# Patient Record
Sex: Female | Born: 1980
Health system: Southern US, Community
[De-identification: ages and names within clinical notes are randomized; demographics above are authoritative.]

## PROBLEM LIST (undated history)

## (undated) ENCOUNTER — Inpatient Hospital Stay (HOSPITAL_COMMUNITY): Payer: Self-pay

## (undated) DIAGNOSIS — F419 Anxiety disorder, unspecified: Secondary | ICD-10-CM

## (undated) DIAGNOSIS — F329 Major depressive disorder, single episode, unspecified: Secondary | ICD-10-CM

## (undated) DIAGNOSIS — T7840XA Allergy, unspecified, initial encounter: Secondary | ICD-10-CM

## (undated) DIAGNOSIS — A159 Respiratory tuberculosis unspecified: Secondary | ICD-10-CM

## (undated) DIAGNOSIS — R87619 Unspecified abnormal cytological findings in specimens from cervix uteri: Secondary | ICD-10-CM

## (undated) DIAGNOSIS — G43909 Migraine, unspecified, not intractable, without status migrainosus: Secondary | ICD-10-CM

## (undated) DIAGNOSIS — F32A Depression, unspecified: Secondary | ICD-10-CM

## (undated) DIAGNOSIS — IMO0002 Reserved for concepts with insufficient information to code with codable children: Secondary | ICD-10-CM

## (undated) DIAGNOSIS — G971 Other reaction to spinal and lumbar puncture: Secondary | ICD-10-CM

## (undated) HISTORY — DX: Allergy, unspecified, initial encounter: T78.40XA

## (undated) HISTORY — DX: Anxiety disorder, unspecified: F41.9

## (undated) HISTORY — PX: DILATION AND CURETTAGE OF UTERUS: SHX78

---

## 2006-07-24 ENCOUNTER — Inpatient Hospital Stay (HOSPITAL_COMMUNITY): Admission: AD | Admit: 2006-07-24 | Discharge: 2006-07-24 | Payer: Self-pay | Admitting: Obstetrics and Gynecology

## 2007-10-07 ENCOUNTER — Emergency Department (HOSPITAL_COMMUNITY): Admission: EM | Admit: 2007-10-07 | Discharge: 2007-10-07 | Payer: Self-pay | Admitting: Emergency Medicine

## 2008-04-04 ENCOUNTER — Emergency Department (HOSPITAL_COMMUNITY): Admission: EM | Admit: 2008-04-04 | Discharge: 2008-04-04 | Payer: Self-pay | Admitting: Emergency Medicine

## 2008-04-04 IMAGING — US US PELVIS COMPLETE MODIFY
1 series · 14 of 25 positions shown · non-contrast
Comparison: None.

CLINICAL DATA: Abdominal pain.

TRANSABDOMINAL AND TRANSVAGINAL ULTRASOUND OF PELVIS
TECHNIQUE: Both transabdominal and transvaginal ultrasound
examinations of the pelvis were performed including evaluation of
the uterus, ovaries, adnexal regions, and pelvic cul-de-sac.

[Series 1: unknown · 0.32mm/px · 14 of 59 slices shown]
[im 1/59]
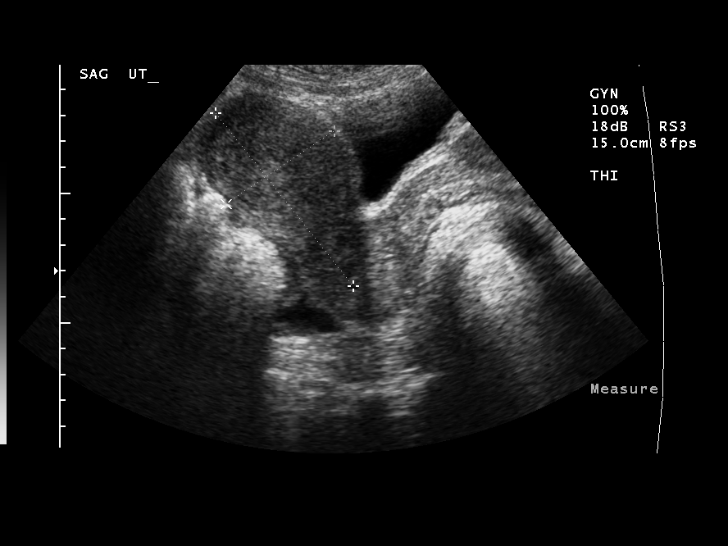
[im 5/59]
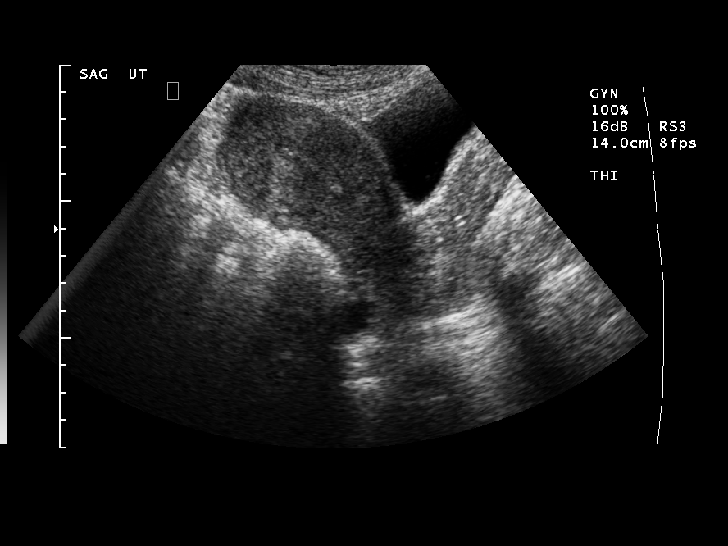
[im 10/59]
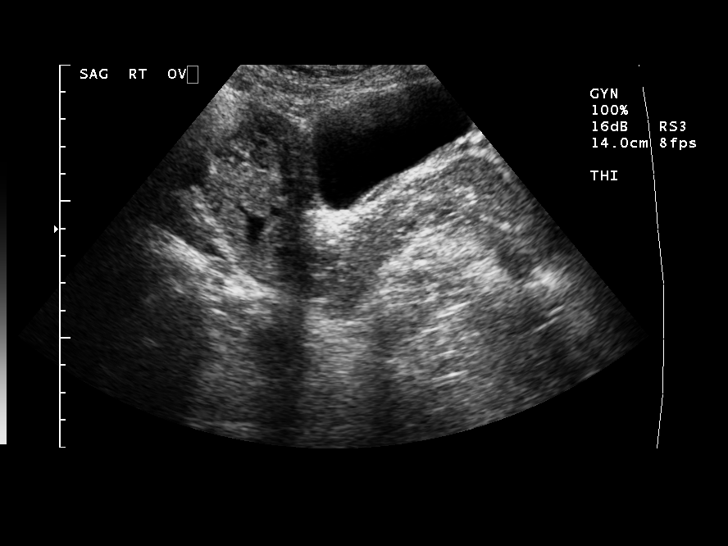
[im 15/59]
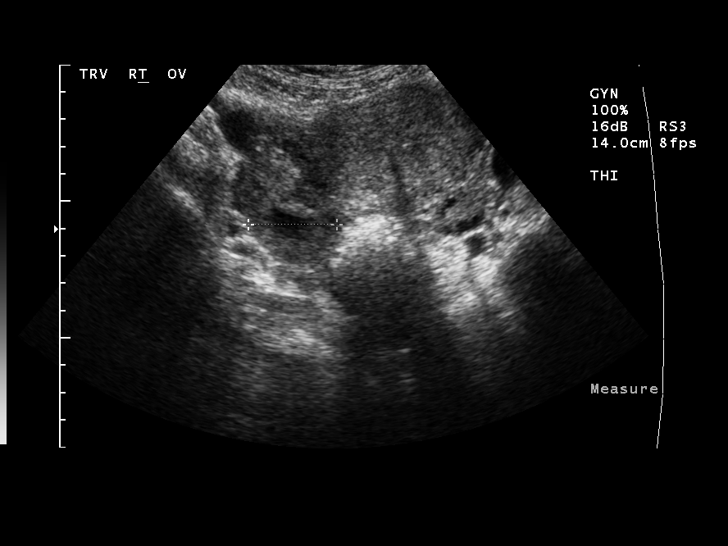
[im 20/59]
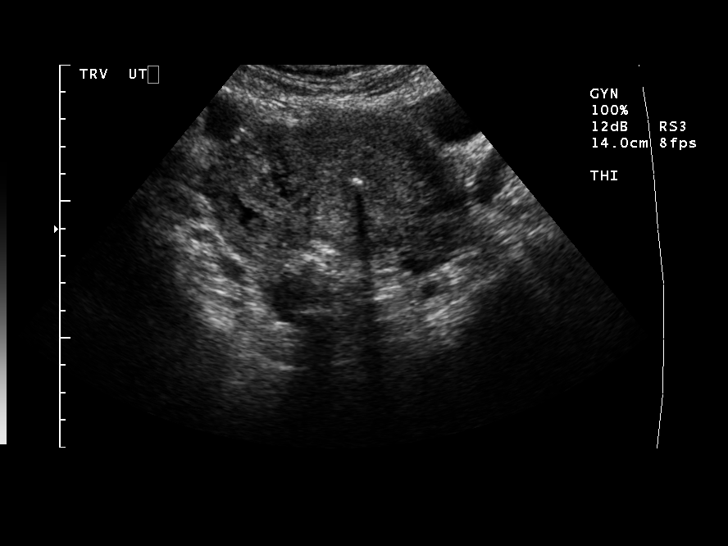
[im 22/59]
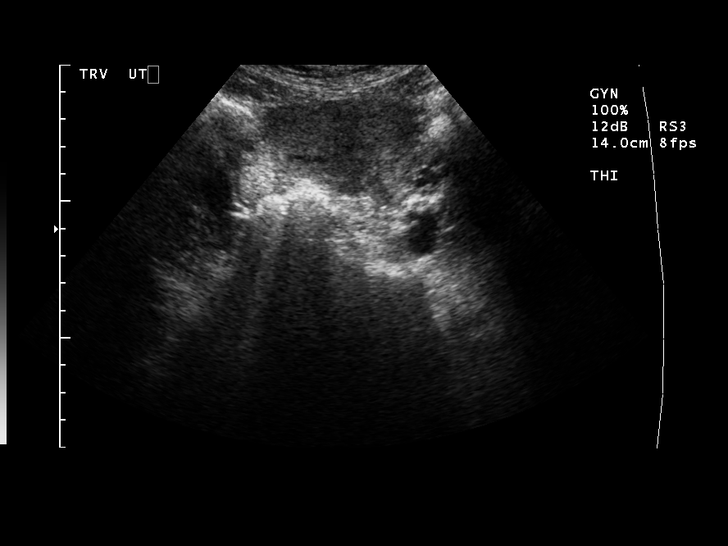
[im 27/59]
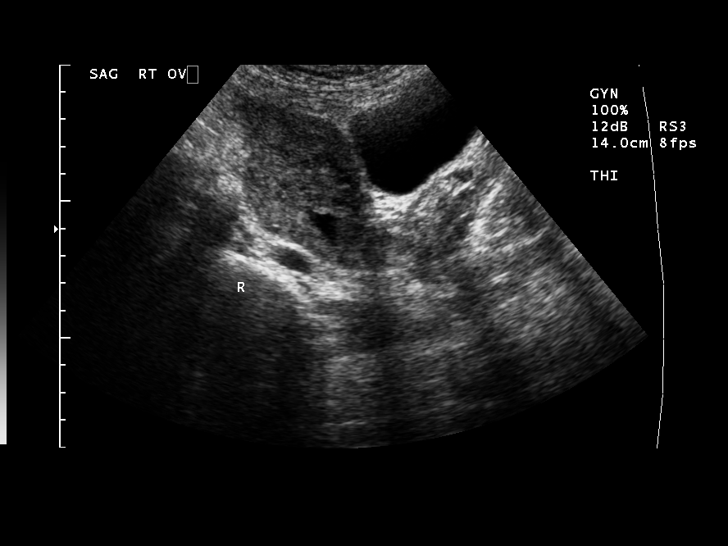
[im 32/59]
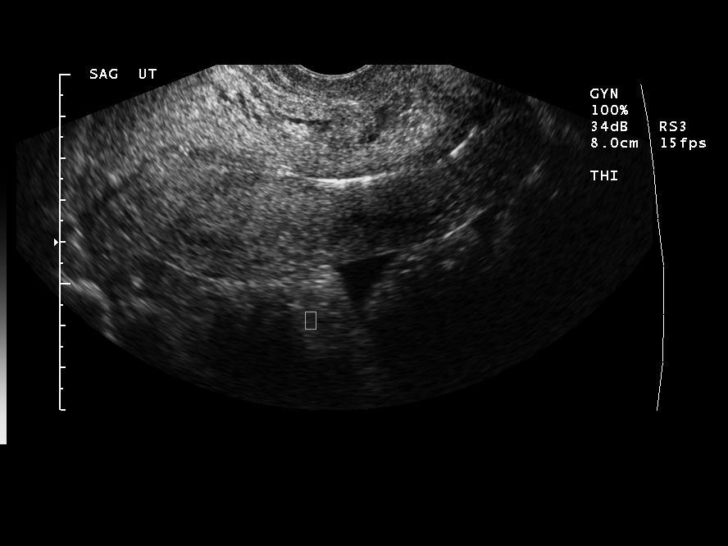
[im 37/59]
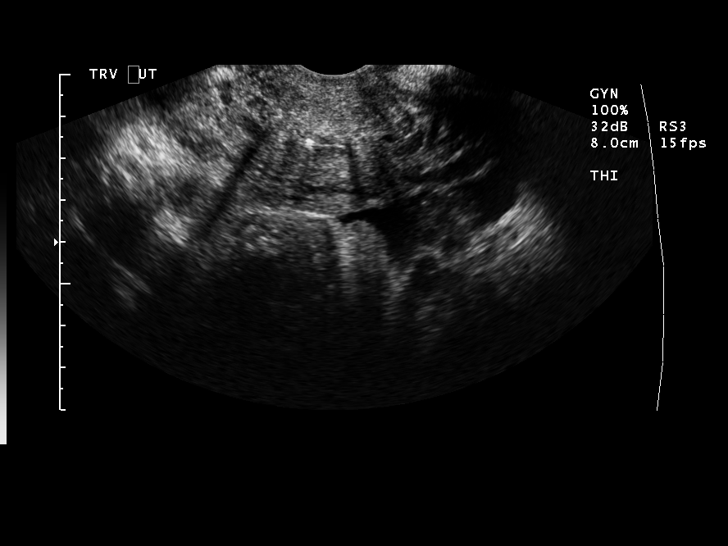
[im 39/59]
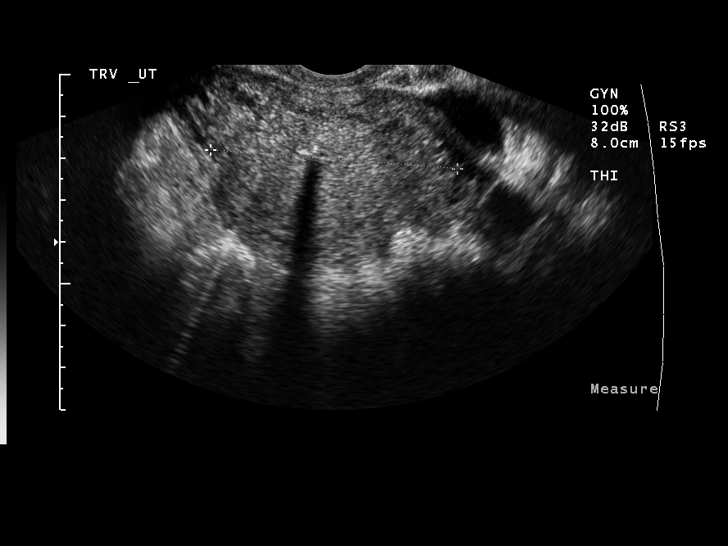
[im 44/59]
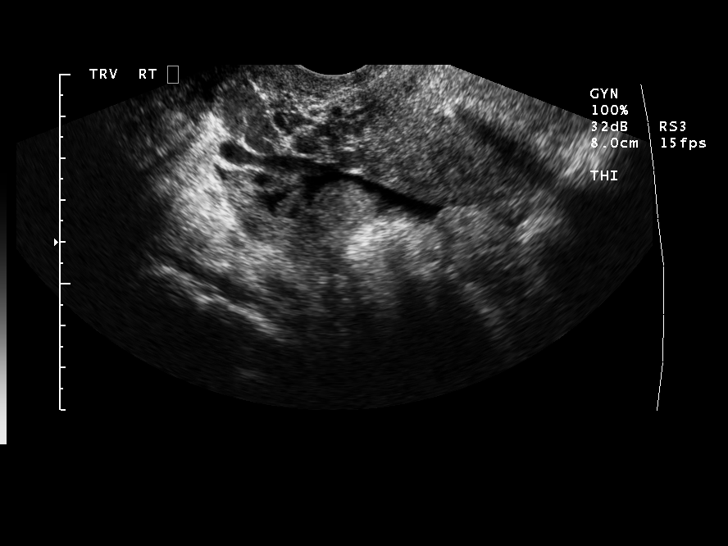
[im 49/59]
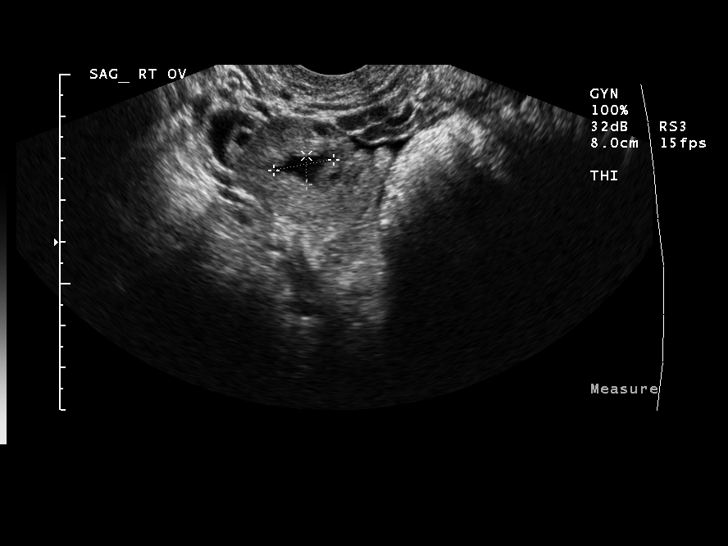
[im 54/59]
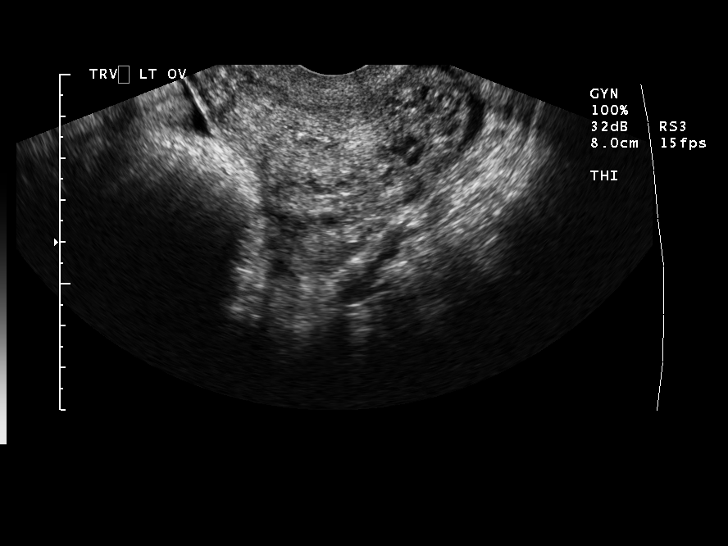
[im 59/59]
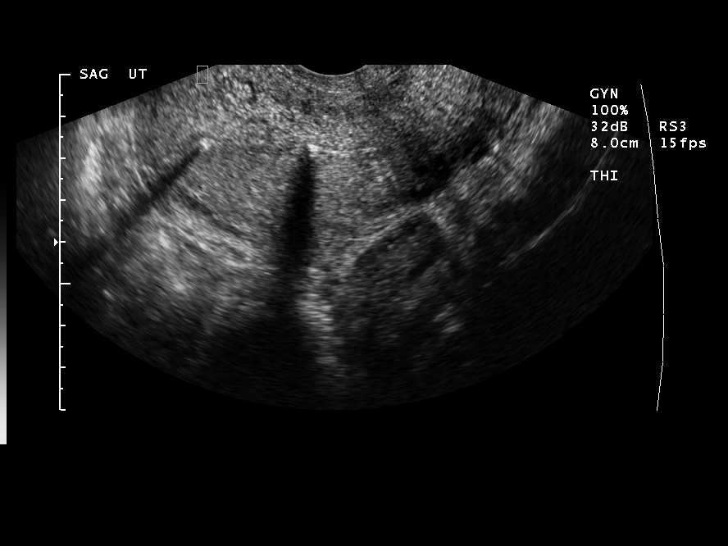

[14 of 25 positions shown; findings below may reference images not displayed]

FINDINGS: Uterus measures 8.5 x 5 x 6.8 cm.  IUD is in place.
Endometrial lining thickness 4.1 mm.  No focal uterine mass noted.

Right ovary measures 4.9 x 2.8 x 3.2 cm.  There is a dominant
cystic appearing structure measuring up to 1.5 cm which has
appearance suggesting ruptured cyst / collapsed follicle.

Left ovary measures 2.9 x 1.7 x 3.5 cm with small follicles but
without evidence of a dominant mass.

Small amount of free fluid is noted.
IMPRESSION: IUD is in place with the uterus otherwise unremarkable.

Collapsed right ovarian cyst / follicle.

No dominant left ovarian mass.

Small amount free fluid.

## 2008-10-13 ENCOUNTER — Emergency Department (HOSPITAL_COMMUNITY): Admission: EM | Admit: 2008-10-13 | Discharge: 2008-10-13 | Payer: Self-pay | Admitting: Emergency Medicine

## 2008-11-28 ENCOUNTER — Emergency Department (HOSPITAL_COMMUNITY): Admission: EM | Admit: 2008-11-28 | Discharge: 2008-11-28 | Payer: Self-pay | Admitting: Emergency Medicine

## 2009-01-18 ENCOUNTER — Emergency Department (HOSPITAL_COMMUNITY): Admission: EM | Admit: 2009-01-18 | Discharge: 2009-01-18 | Payer: Self-pay | Admitting: Emergency Medicine

## 2009-06-18 ENCOUNTER — Emergency Department (HOSPITAL_COMMUNITY): Admission: EM | Admit: 2009-06-18 | Discharge: 2009-06-18 | Payer: Self-pay | Admitting: Emergency Medicine

## 2009-06-18 IMAGING — CR DG ABDOMEN ACUTE W/ 1V CHEST
3 series · 3 of 3 positions shown · non-contrast
Comparison: None.

CLINICAL DATA: Nausea, abdominal cramping, diarrhea

ACUTE ABDOMEN SERIES (ABDOMEN 2 VIEW & CHEST 1 VIEW)

[w chest pa]
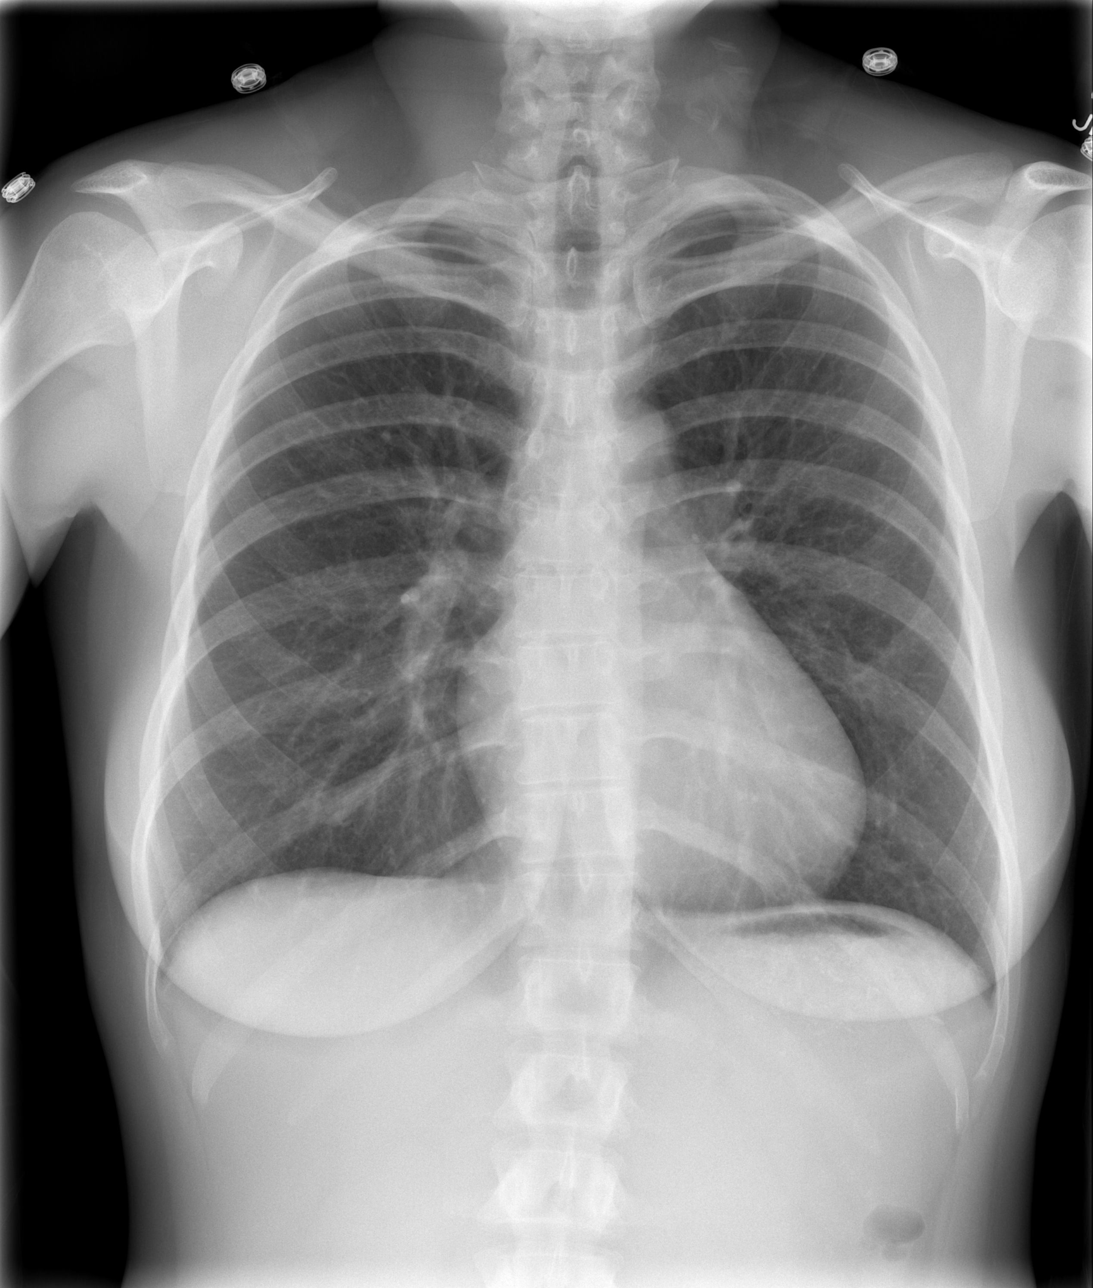

[w abdomen upright *]
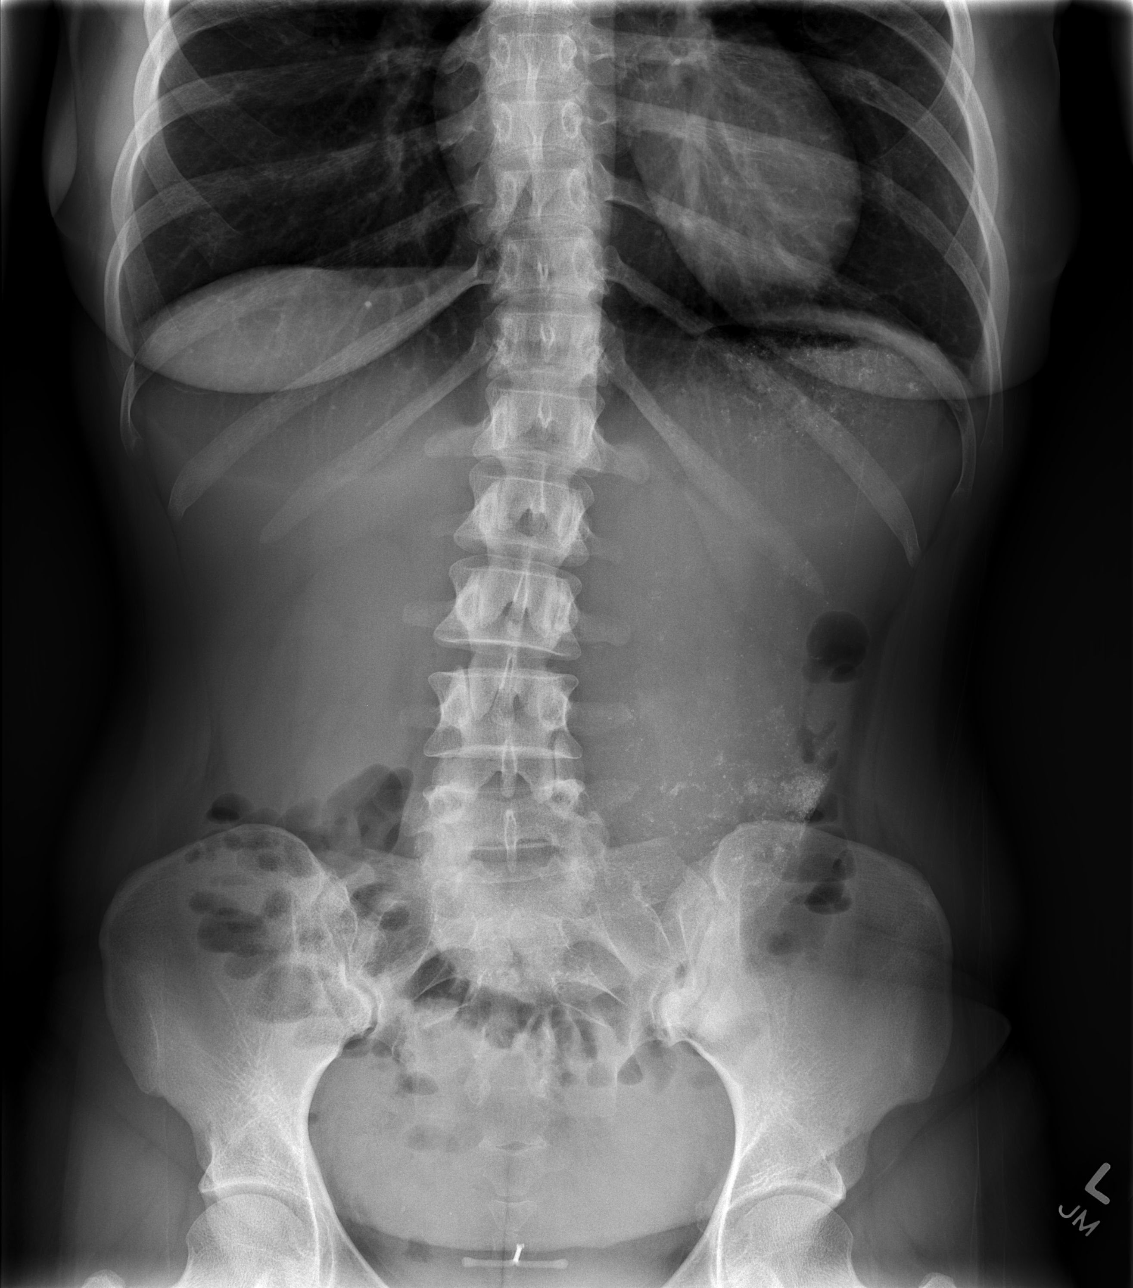

[t abdomen supine]
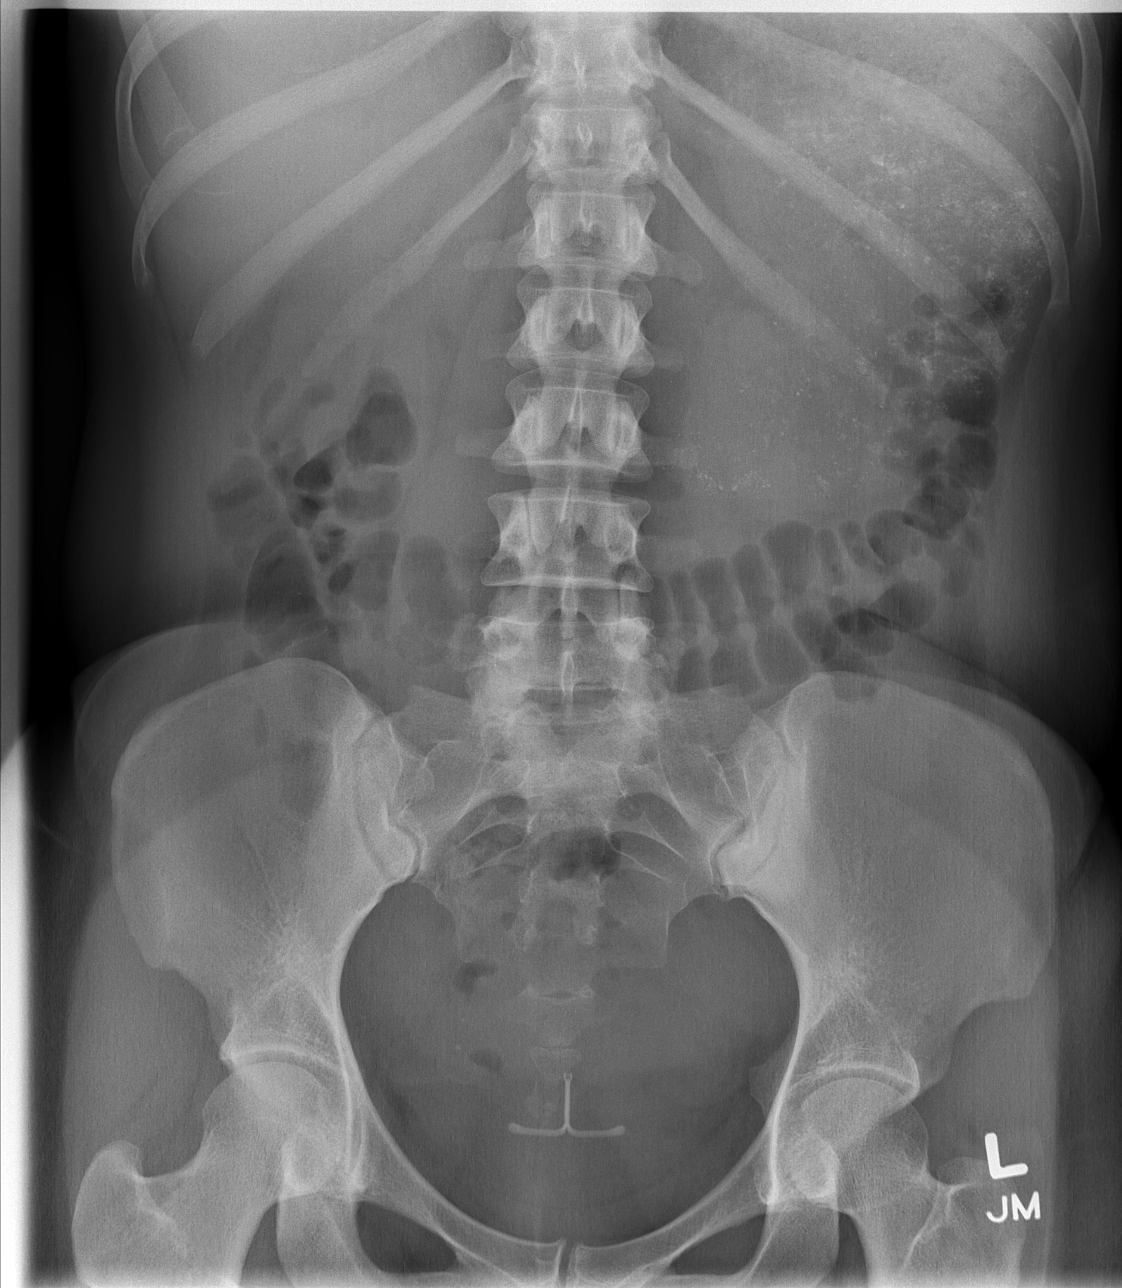

[3 of 3 positions shown; findings below may reference images not displayed]

FINDINGS: The lungs are clear.  Mediastinal contours are normal.
The heart is within normal limits in size.  No bony abnormality is
seen.

Supine and erect views the abdomen show a large amount of fluid
within the stomach.  No bowel obstruction is seen and no free air
is noted.  An IUD is noted in the mid pelvis.
IMPRESSION: 1.  No active lung disease.
2.  No bowel obstruction or free air.  Large amount of fluid within
the stomach.

## 2009-12-31 ENCOUNTER — Emergency Department (HOSPITAL_COMMUNITY): Admission: EM | Admit: 2009-12-31 | Discharge: 2009-12-31 | Payer: Self-pay | Admitting: Emergency Medicine

## 2010-07-27 LAB — POCT I-STAT, CHEM 8
Calcium, Ion: 0.85 mmol/L — ABNORMAL LOW (ref 1.12–1.32)
Chloride: 109 mEq/L (ref 96–112)
Creatinine, Ser: 0.7 mg/dL (ref 0.4–1.2)
Glucose, Bld: 99 mg/dL (ref 70–99)
Hemoglobin: 15 g/dL (ref 12.0–15.0)
Sodium: 142 mEq/L (ref 135–145)

## 2010-07-27 LAB — URINALYSIS, ROUTINE W REFLEX MICROSCOPIC
Glucose, UA: NEGATIVE mg/dL
Leukocytes, UA: NEGATIVE
Nitrite: NEGATIVE
Protein, ur: NEGATIVE mg/dL
Specific Gravity, Urine: 1.028 (ref 1.005–1.030)

## 2010-07-27 LAB — URINE MICROSCOPIC-ADD ON

## 2010-08-12 LAB — WET PREP, GENITAL
Trich, Wet Prep: NONE SEEN
WBC, Wet Prep HPF POC: NONE SEEN

## 2010-08-14 LAB — URINALYSIS, ROUTINE W REFLEX MICROSCOPIC
Bilirubin Urine: NEGATIVE
Glucose, UA: NEGATIVE mg/dL
Ketones, ur: NEGATIVE mg/dL
Nitrite: NEGATIVE
Urobilinogen, UA: 0.2 mg/dL (ref 0.0–1.0)

## 2010-08-14 LAB — WET PREP, GENITAL
Trich, Wet Prep: NONE SEEN
Yeast Wet Prep HPF POC: NONE SEEN

## 2010-08-14 LAB — GC/CHLAMYDIA PROBE AMP, GENITAL: GC Probe Amp, Genital: NEGATIVE

## 2010-08-15 LAB — URINALYSIS, ROUTINE W REFLEX MICROSCOPIC
Glucose, UA: NEGATIVE mg/dL
Nitrite: NEGATIVE
Protein, ur: NEGATIVE mg/dL
Specific Gravity, Urine: 1.012 (ref 1.005–1.030)

## 2010-08-15 LAB — URINE CULTURE: Colony Count: >=100000

## 2010-08-15 LAB — URINE MICROSCOPIC-ADD ON

## 2010-08-15 LAB — POCT PREGNANCY, URINE: Preg Test, Ur: NEGATIVE

## 2011-02-02 LAB — URINALYSIS, ROUTINE W REFLEX MICROSCOPIC
Bilirubin Urine: NEGATIVE
Glucose, UA: NEGATIVE
Leukocytes, UA: NEGATIVE
Nitrite: NEGATIVE
Protein, ur: NEGATIVE
Specific Gravity, Urine: 1.015
pH: 6

## 2011-02-02 LAB — DIFFERENTIAL
Lymphs Abs: 0.4 — ABNORMAL LOW
Neutrophils Relative %: 90 — ABNORMAL HIGH

## 2011-02-02 LAB — CBC
Hemoglobin: 14.8
MCHC: 33.4
MCV: 89
Platelets: 221
RBC: 4.98
RDW: 12.2
WBC: 10.6 — ABNORMAL HIGH

## 2011-02-02 LAB — POCT I-STAT, CHEM 8
BUN: 8
Calcium, Ion: 1.13
Chloride: 105
Potassium: 3.7
TCO2: 23

## 2011-02-02 LAB — WET PREP, GENITAL: Yeast Wet Prep HPF POC: NONE SEEN

## 2011-02-02 LAB — URINE MICROSCOPIC-ADD ON

## 2011-02-07 LAB — DIFFERENTIAL
Basophils Relative: 1
Eosinophils Absolute: 0.2
Neutrophils Relative %: 67

## 2011-02-07 LAB — BASIC METABOLIC PANEL
BUN: 10
CO2: 23
Chloride: 108
Creatinine, Ser: 0.55

## 2011-02-07 LAB — URINALYSIS, ROUTINE W REFLEX MICROSCOPIC
Glucose, UA: NEGATIVE
Hgb urine dipstick: NEGATIVE
Specific Gravity, Urine: 1.018
Urobilinogen, UA: 0.2

## 2011-02-07 LAB — GC/CHLAMYDIA PROBE AMP, GENITAL
Chlamydia, DNA Probe: NEGATIVE
GC Probe Amp, Genital: NEGATIVE

## 2011-02-07 LAB — PREGNANCY, URINE: Preg Test, Ur: NEGATIVE

## 2011-02-07 LAB — CBC
MCHC: 33.8
MCV: 90.8
Platelets: 178
RDW: 13.1

## 2011-02-07 LAB — WET PREP, GENITAL

## 2011-05-09 NOTE — L&D Delivery Note (Addendum)
Delivery Note At 4:49 AM a viable female was delivered via Vaginal, Spontaneous Delivery (Presentation:LOA ) with large amount of light MSF evident at birth.  APGAR: 8, 9; weight: pending Placenta status: Intact, spontaneously delivered via schultz w/ maternal pushing efforts.  Cord: 3vc, with the following complications: None.    Anesthesia: Epidural  Episiotomy: None Lacerations: None Suture Repair: n/a Est. Blood Loss (mL): 300  Mom to postpartum.  Baby to nursery-stable.  Wants to breastfeed.  Desires IP BTL- papers signed and in prenatals under media tab. NPO for now until know when BTL will occur.  Marge Duncans 03/24/2012, 5:12 AM

## 2011-05-31 ENCOUNTER — Encounter (HOSPITAL_COMMUNITY): Payer: Self-pay | Admitting: Emergency Medicine

## 2011-05-31 ENCOUNTER — Emergency Department (HOSPITAL_COMMUNITY)
Admission: EM | Admit: 2011-05-31 | Discharge: 2011-05-31 | Disposition: A | Discharge status: Home / Self Care | Payer: Self-pay | Attending: Emergency Medicine | Admitting: Emergency Medicine

## 2011-05-31 DIAGNOSIS — R112 Nausea with vomiting, unspecified: Secondary | ICD-10-CM | POA: Insufficient documentation

## 2011-05-31 DIAGNOSIS — F17200 Nicotine dependence, unspecified, uncomplicated: Secondary | ICD-10-CM | POA: Insufficient documentation

## 2011-05-31 DIAGNOSIS — R197 Diarrhea, unspecified: Secondary | ICD-10-CM | POA: Insufficient documentation

## 2011-05-31 DIAGNOSIS — R109 Unspecified abdominal pain: Secondary | ICD-10-CM | POA: Insufficient documentation

## 2011-05-31 DIAGNOSIS — IMO0001 Reserved for inherently not codable concepts without codable children: Secondary | ICD-10-CM | POA: Insufficient documentation

## 2011-05-31 DIAGNOSIS — R6883 Chills (without fever): Secondary | ICD-10-CM | POA: Insufficient documentation

## 2011-05-31 LAB — URINALYSIS, ROUTINE W REFLEX MICROSCOPIC
Ketones, ur: 15 mg/dL — AB
Leukocytes, UA: NEGATIVE
Nitrite: NEGATIVE
Protein, ur: NEGATIVE mg/dL
Urobilinogen, UA: 1 mg/dL (ref 0.0–1.0)
pH: 7.5 (ref 5.0–8.0)

## 2011-05-31 MED ORDER — ONDANSETRON 4 MG PO TBDP
ORAL_TABLET | ORAL | Status: AC
  Administered 2011-05-31: 4 mg via ORAL
  Filled 2011-05-31: qty 1

## 2011-05-31 MED ORDER — ONDANSETRON 4 MG PO TBDP
8.0000 mg | ORAL_TABLET | Freq: Once | ORAL | Status: AC
  Administered 2011-05-31 (×2): 4 mg via ORAL
  Filled 2011-05-31: qty 1

## 2011-05-31 NOTE — ED Provider Notes (Signed)
History     CSN: [id number]  Arrival date & time 05/31/11  [name] [name]   First MD Initiated Contact with Patient 05/31/11 2042      Chief Complaint  Patient presents with  . Emesis     Patient is a 31 y.o. female presenting with vomiting. The history is provided by the patient.  Emesis  This is a new problem. The current episode started 6 to 12 hours ago. The problem occurs 2 to 4 times per day. The problem has been gradually improving. The emesis has an appearance of stomach contents. There has been no fever. Associated symptoms include abdominal pain, chills, diarrhea and myalgias. Risk factors include suspect food intake and ill contacts.  pt denies fever No blood in stool/vomitus No recent abx Thinks it is related to recent meal with chicken +sick contacts Also, had IUD removed today, but no vag bleeding/discharge reported She did have some abd cramping but none at this time  PMh - none  History reviewed. No pertinent past surgical history.  No family history on file.  History  Substance Use Topics  . Smoking status: Current Everyday Smoker  . Smokeless tobacco: Not on file  . Alcohol Use: Yes    OB History    Grav Para Term Preterm Abortions TAB SAB Ect Mult Living                  Review of Systems  Constitutional: Positive for chills.  Gastrointestinal: Positive for vomiting, abdominal pain and diarrhea.  Musculoskeletal: Positive for myalgias.    Allergies  Lexapro  Home Medications   Current Outpatient Rx  Name Route Sig Dispense Refill  . ACETAMINOPHEN 500 MG PO TABS Oral Take 500 mg by mouth every 6 (six) hours as needed. For pain      BP 102/63  Pulse 82  Temp(Src) 98.7 F (37.1 C) (Oral)  Resp 16  SpO2 99%  LMP 04/27/2011  Physical Exam CONSTITUTIONAL: Well developed/well nourished HEAD AND FACE: Normocephalic/atraumatic EYES: EOMI/PERRL, no scleral icterus ENMT: Mucous membranes moist NECK: supple no meningeal signs SPINE:entire spine  nontender CV: S1/S2 noted, no murmurs/rubs/gallops noted LUNGS: Lungs are clear to auscultation bilaterally, no apparent distress ABDOMEN: soft, nontender, no rebound or guarding GU:no cva tenderness NEURO: Pt is awake/alert, moves all extremitiesx4 EXTREMITIES: pulses normal, full ROM SKIN: warm, color normal PSYCH: no abnormalities of mood noted  ED Course  Procedures  Labs Reviewed  URINALYSIS, ROUTINE W REFLEX MICROSCOPIC - Abnormal; Notable for the following:    Specific Gravity, Urine 1.037 (*)    Ketones, ur 15 (*)    All other components within normal limits  POCT PREGNANCY, URINE  POCT PREGNANCY, URINE     1. Nausea vomiting and diarrhea       MDM  Nursing notes reviewed and considered in documentation All labs/vitals reviewed and considered  Pt improved with oral zofran, no distress, well appearing With both vomiting and diarrhea over past day, suspect self limited process Doubt acute abd/gyn process        Joya Gaskins, MD 05/31/11 2133

## 2011-05-31 NOTE — ED Notes (Signed)
Pt. Escorted to [facility] to find family.

## 2011-05-31 NOTE — ED Notes (Signed)
Pt c/o nausea, vomiting, abd cramping, and chills since this morning.  Symptoms worse since having mirena removed at noon.  Pt thinks symptoms are related to eating chicken that she did not cook completely last night.

## 2011-08-21 ENCOUNTER — Other Ambulatory Visit (HOSPITAL_COMMUNITY): Payer: Self-pay | Admitting: Family

## 2011-08-21 DIAGNOSIS — Z331 Pregnant state, incidental: Secondary | ICD-10-CM

## 2011-08-21 LAB — OB RESULTS CONSOLE HIV ANTIBODY (ROUTINE TESTING): HIV: NONREACTIVE

## 2011-08-21 LAB — OB RESULTS CONSOLE RUBELLA ANTIBODY, IGM: Rubella: IMMUNE

## 2011-08-21 LAB — OB RESULTS CONSOLE GC/CHLAMYDIA
Chlamydia: NEGATIVE
Gonorrhea: NEGATIVE

## 2011-08-21 LAB — OB RESULTS CONSOLE HEPATITIS B SURFACE ANTIGEN: Hepatitis B Surface Ag: NEGATIVE

## 2011-08-21 LAB — OB RESULTS CONSOLE ANTIBODY SCREEN: Antibody Screen: NEGATIVE

## 2011-08-21 LAB — OB RESULTS CONSOLE ABO/RH

## 2011-08-28 ENCOUNTER — Other Ambulatory Visit (HOSPITAL_COMMUNITY): Payer: Self-pay | Admitting: Family

## 2011-08-28 ENCOUNTER — Ambulatory Visit (HOSPITAL_COMMUNITY)
Admission: RE | Admit: 2011-08-28 | Discharge: 2011-08-28 | Disposition: A | Discharge status: Home / Self Care | Payer: Medicaid Other | Source: Ambulatory Visit | Attending: Family | Admitting: Family

## 2011-08-28 DIAGNOSIS — O99330 Smoking (tobacco) complicating pregnancy, unspecified trimester: Secondary | ICD-10-CM | POA: Insufficient documentation

## 2011-08-28 DIAGNOSIS — Z331 Pregnant state, incidental: Secondary | ICD-10-CM

## 2011-08-28 DIAGNOSIS — Z3689 Encounter for other specified antenatal screening: Secondary | ICD-10-CM | POA: Insufficient documentation

## 2011-08-28 IMAGING — US US OB COMP LESS 14 WK
1 series · 13 of 28 positions shown · non-contrast
Comparison: none

[Series 1: us ob comp +14 wk · 13 of 45 slices shown]
[im 2/45]
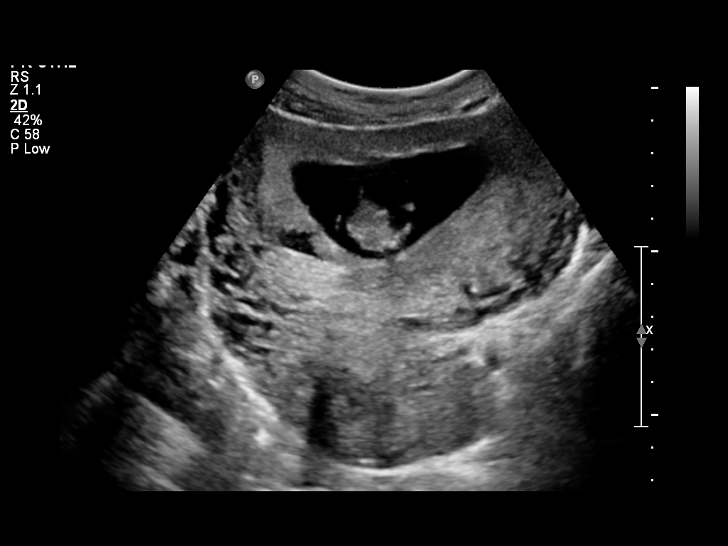
[im 5/45]
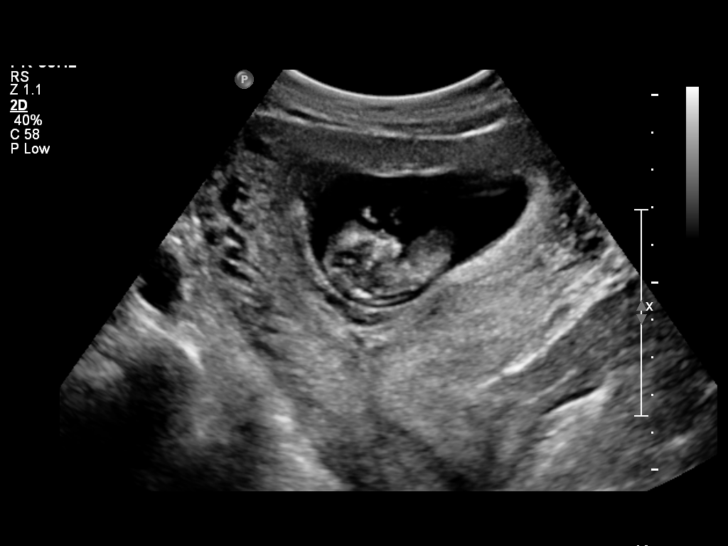
[im 9/45]
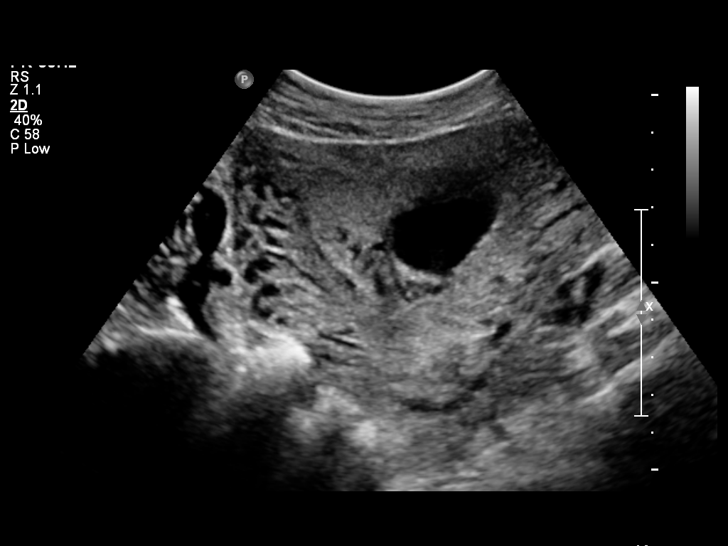
[im 12/45]
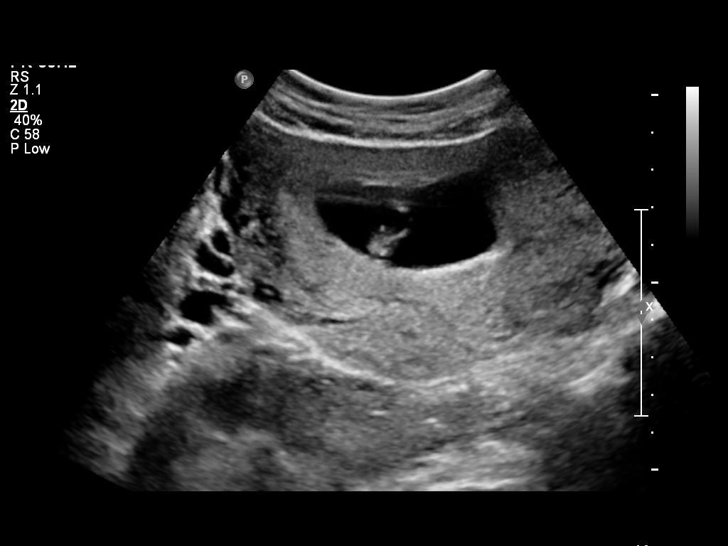
[im 15/45]
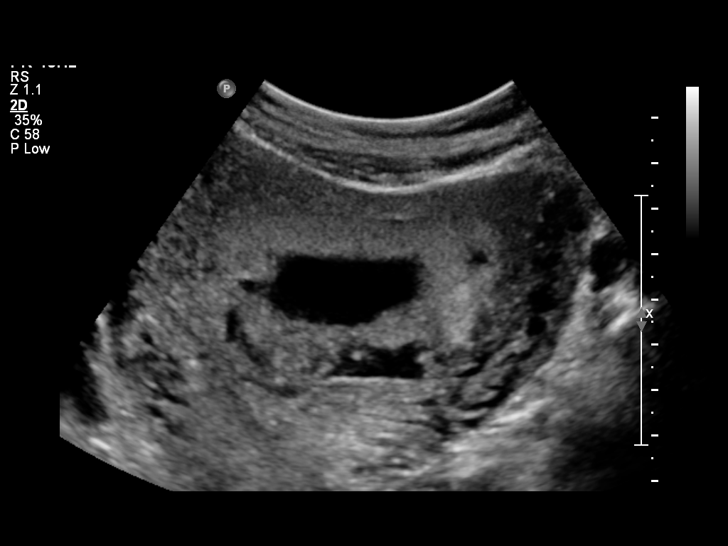
[im 18/45]
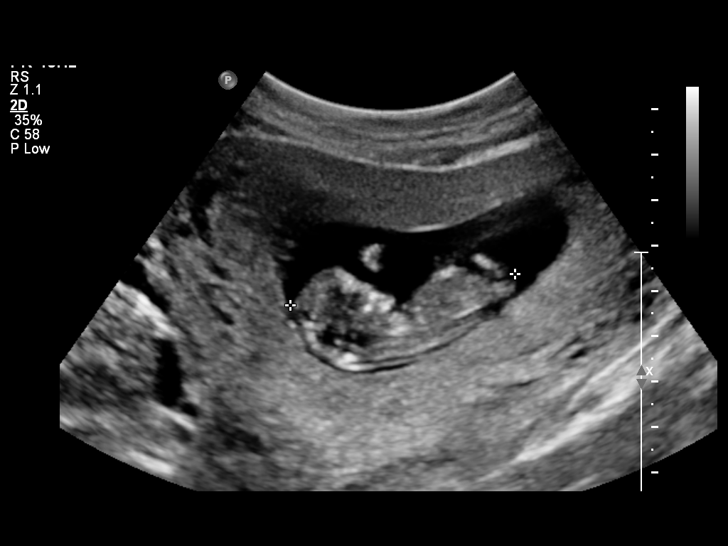
[im 23/45]
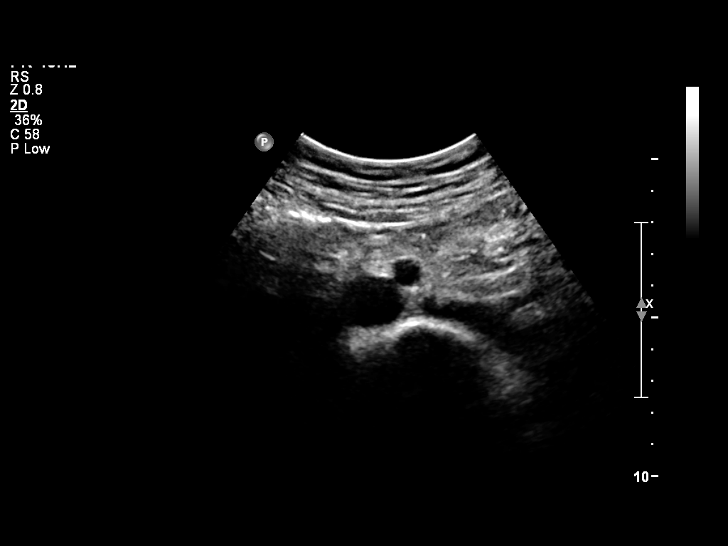
[im 27/45]
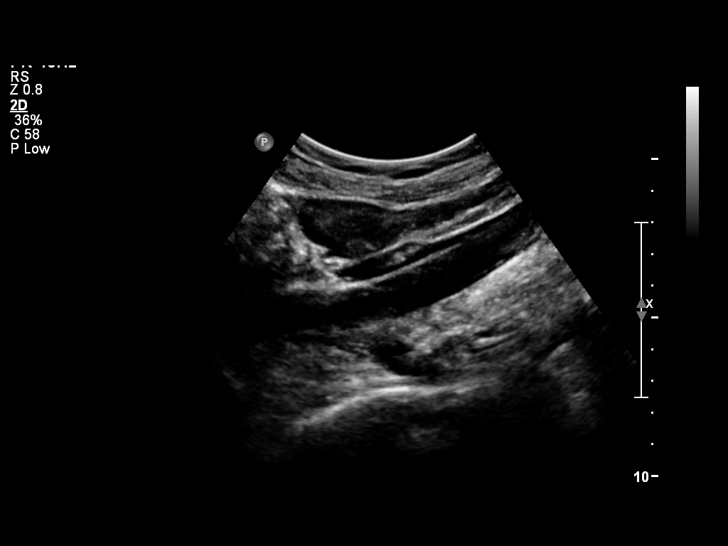
[im 30/45]
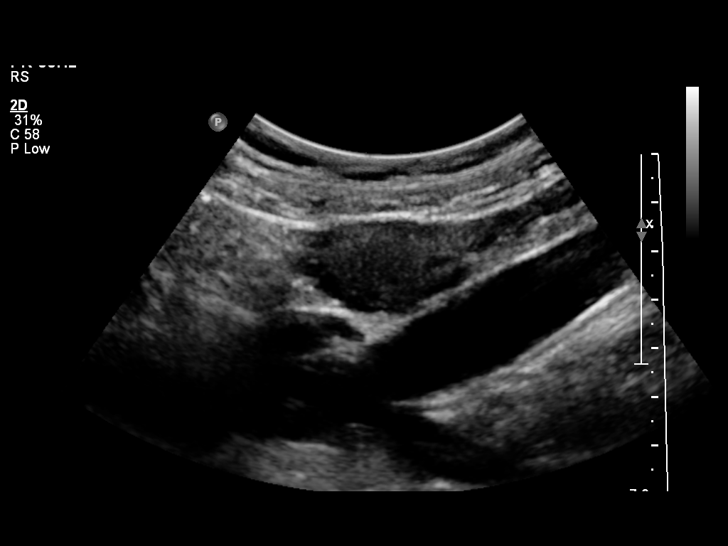
[im 33/45]
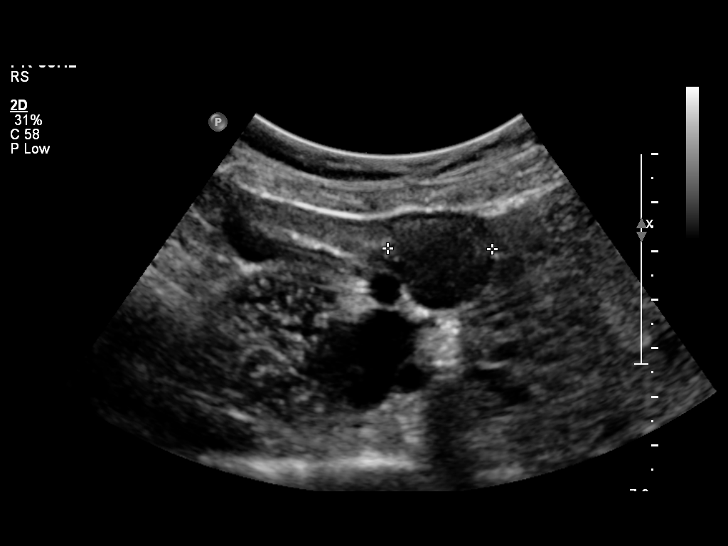
[im 36/45]
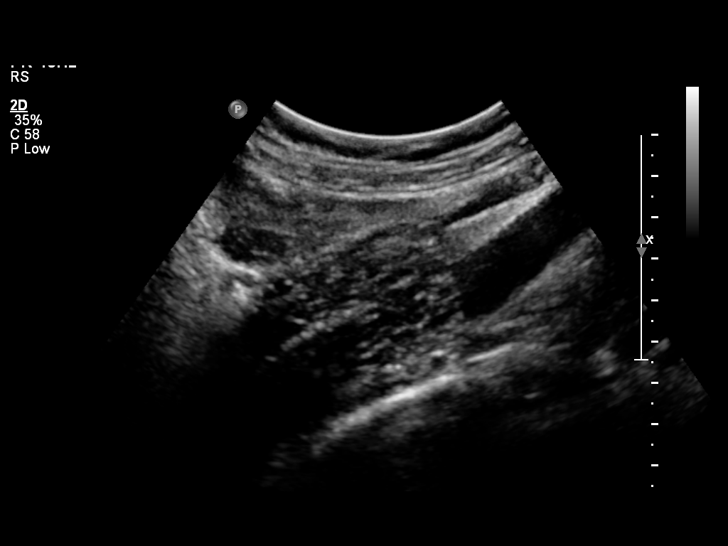
[im 40/45]
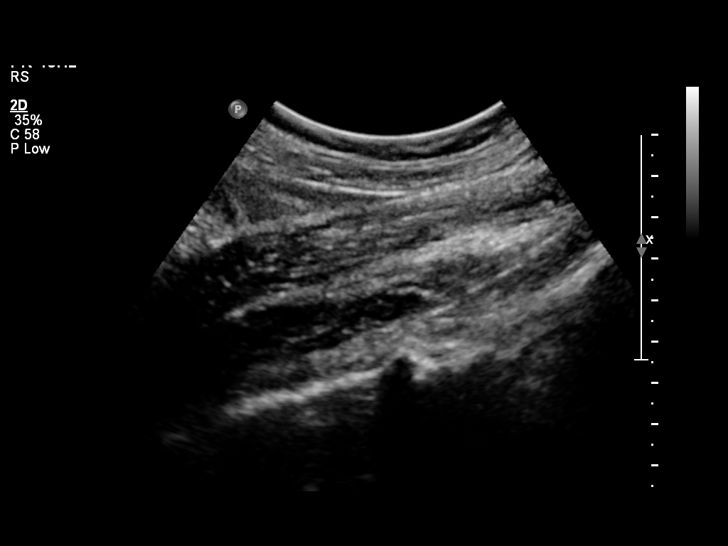
[im 43/45]
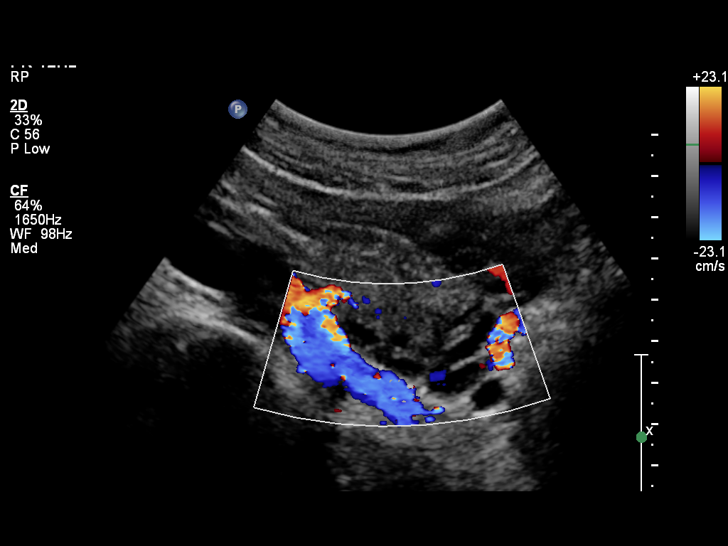

[13 of 28 positions shown; findings below may reference images not displayed]

OBSTETRICS REPORT
                      (Signed Final [DATE] [DATE])

 Order#:         [PHONE_NUMBER]_O
Procedures

 US OB COMP LESS 14 WKS                                76801.0
Indications

 Uncertain LMP;  Establish Gestational [AGE]
 Cigarette smoker
Fetal Evaluation

 Preg. Location:    Intrauterine
 Gest. Sac:         Intrauterine
 Yolk Sac:          Not visualized
 Fetal Pole:        Visualized
 Fetal Heart Rate:  165                         bpm
 Cardiac Activity:  Observed
Biometry

 CRL:     48.9  mm    G. Age:   11w 4d                 EDD:   [DATE]
Gestational Age

 Best:          11w 4d    Det. By:   U/S C R L ([DATE])     EDD:   [DATE]
Cervix Uterus Adnexa

 Cervix:       Closed. Normal appearance by transabdominal scan.
 Uterus:       No abnormality visualized.
 Cul De Sac:   No free fluid seen.
 Left Ovary:   Within normal limits.
 Right Ovary:  Within normal limits. Small corpus luteum noted.
 Adnexa:     No abnormality visualized.
Impression

 Single living IUP with US Gest. Age of 11w 4d, and EDD of
 [DATE].
 Small (1.8 x 0.7 x .9 cm) subchorionic fluid collection. Likely
 small subchorionic hemorrhage.
Recommendations

 US for fetal anatomic evaluation at 18-19 wks GA.

 questions or concerns.

## 2011-09-18 ENCOUNTER — Other Ambulatory Visit (HOSPITAL_COMMUNITY): Payer: Self-pay | Admitting: Family

## 2011-09-18 DIAGNOSIS — Z0489 Encounter for examination and observation for other specified reasons: Secondary | ICD-10-CM

## 2011-09-18 DIAGNOSIS — IMO0002 Reserved for concepts with insufficient information to code with codable children: Secondary | ICD-10-CM

## 2011-10-13 ENCOUNTER — Encounter (HOSPITAL_COMMUNITY): Payer: Self-pay

## 2011-10-13 ENCOUNTER — Ambulatory Visit (HOSPITAL_COMMUNITY)
Admission: RE | Admit: 2011-10-13 | Discharge: 2011-10-13 | Disposition: A | Discharge status: Home / Self Care | Payer: Medicaid Other | Source: Ambulatory Visit | Attending: Family | Admitting: Family

## 2011-10-13 DIAGNOSIS — Z363 Encounter for antenatal screening for malformations: Secondary | ICD-10-CM | POA: Insufficient documentation

## 2011-10-13 DIAGNOSIS — Z0489 Encounter for examination and observation for other specified reasons: Secondary | ICD-10-CM

## 2011-10-13 DIAGNOSIS — Z1389 Encounter for screening for other disorder: Secondary | ICD-10-CM | POA: Insufficient documentation

## 2011-10-13 DIAGNOSIS — O358XX0 Maternal care for other (suspected) fetal abnormality and damage, not applicable or unspecified: Secondary | ICD-10-CM | POA: Insufficient documentation

## 2011-10-13 DIAGNOSIS — IMO0002 Reserved for concepts with insufficient information to code with codable children: Secondary | ICD-10-CM

## 2011-10-13 IMAGING — US US OB DETAIL+14 WK
1 of 2 series · 12 of 28 positions shown · non-contrast
Comparison: none

[Series 1: us ob detail +14 wk · 12 of 101 slices shown]
[im 1/101]
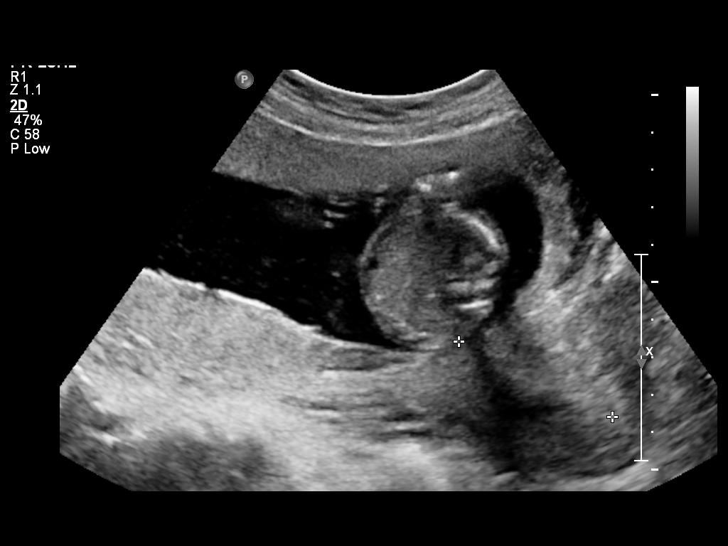
[im 8/101]
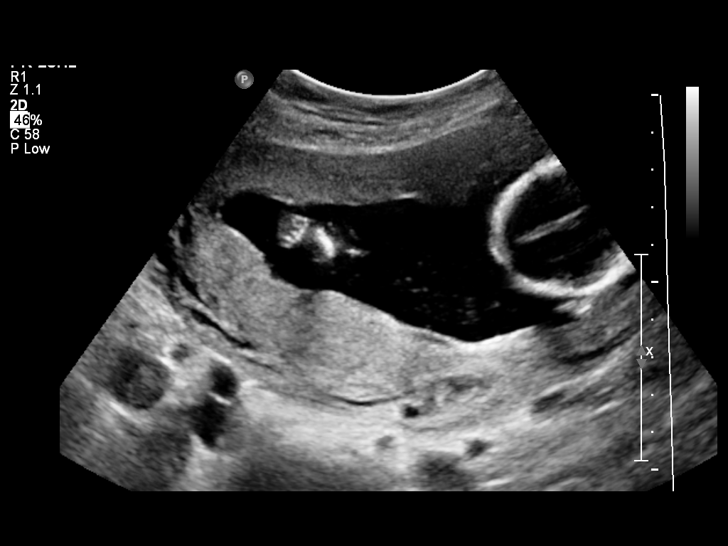
[im 16/101]
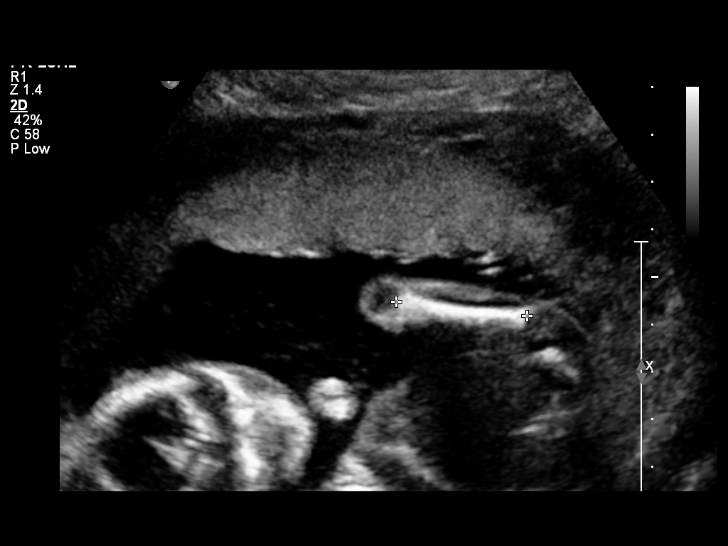
[im 27/101]
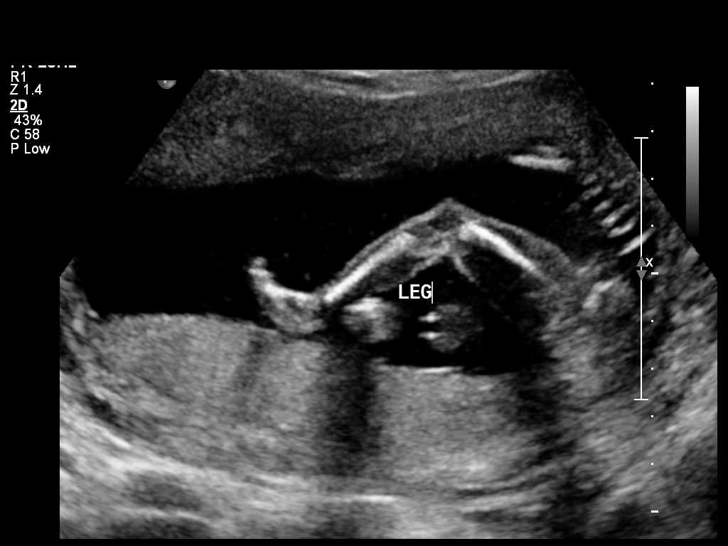
[im 35/101]
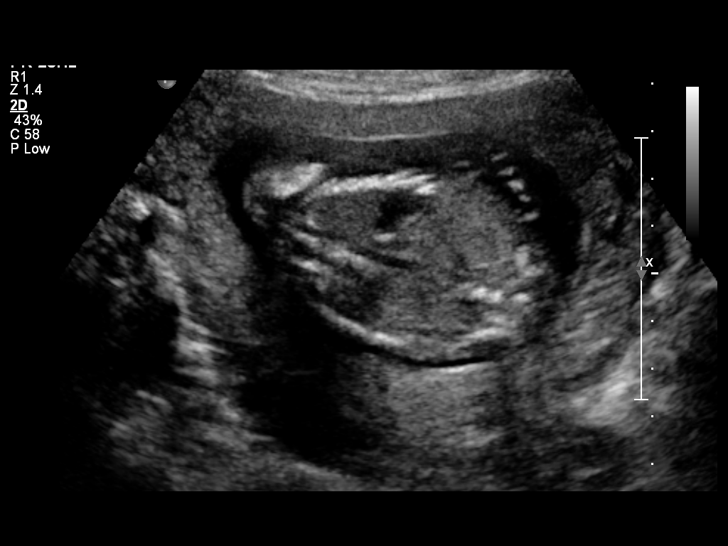
[im 43/101]
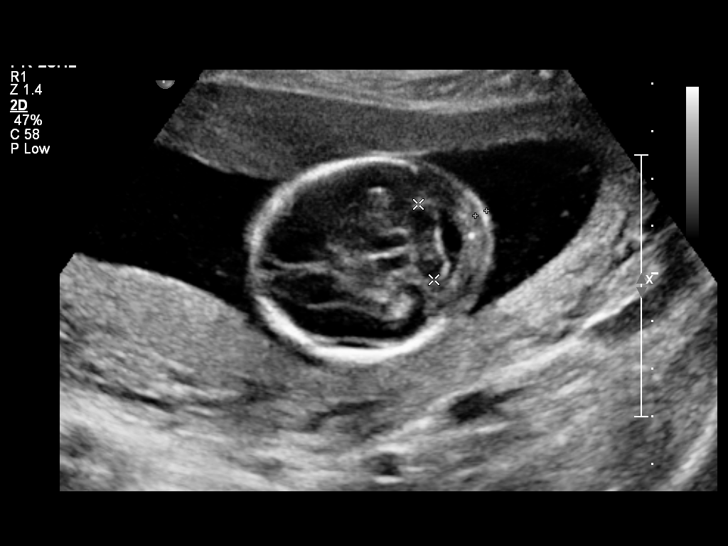
[im 54/101]
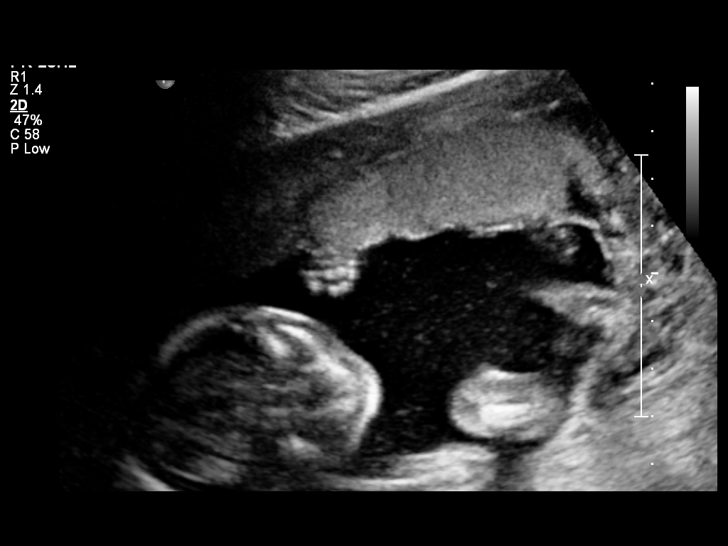
[im 62/101]
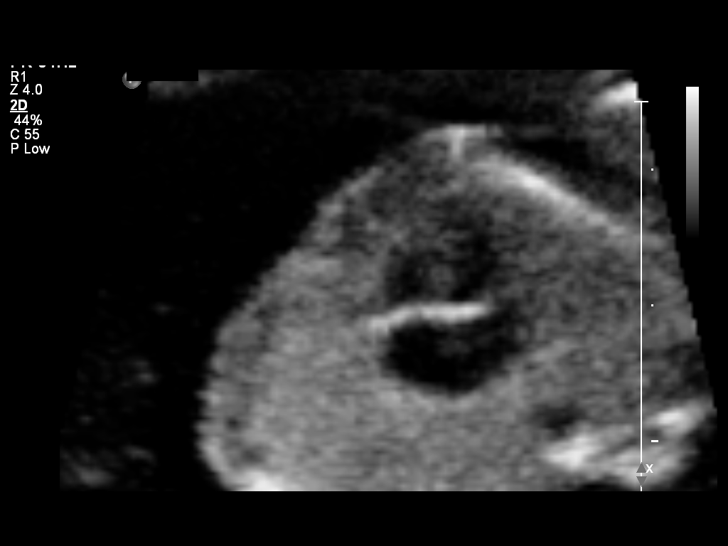
[im 70/101]
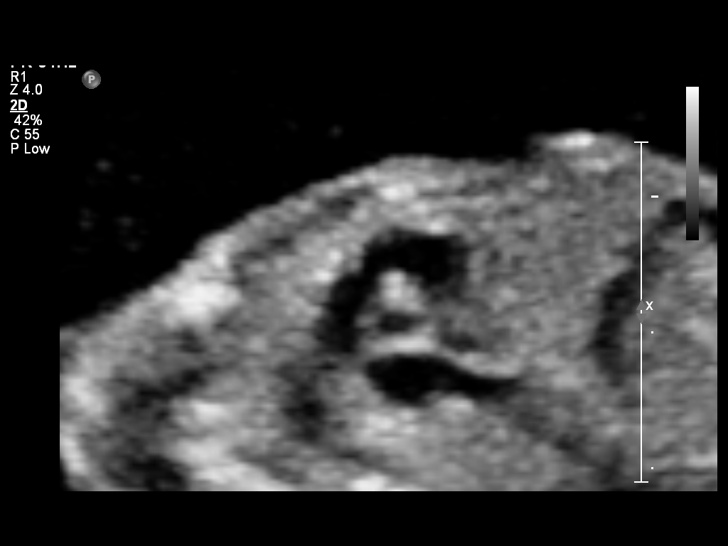
[im 81/101]
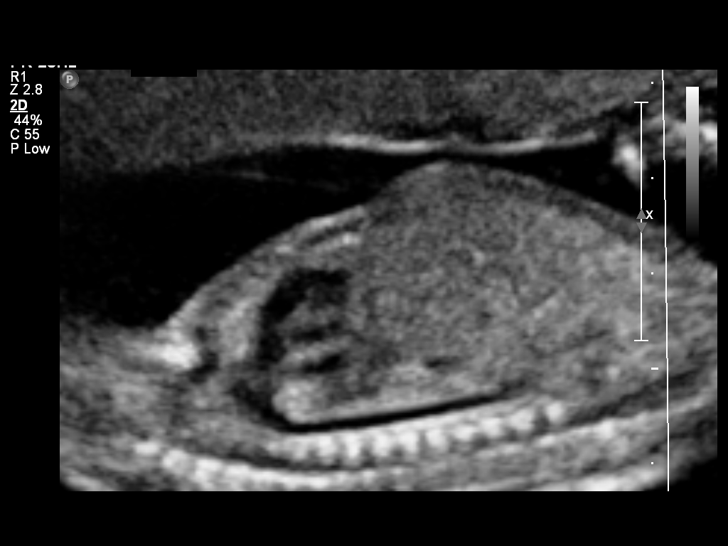
[im 89/101]
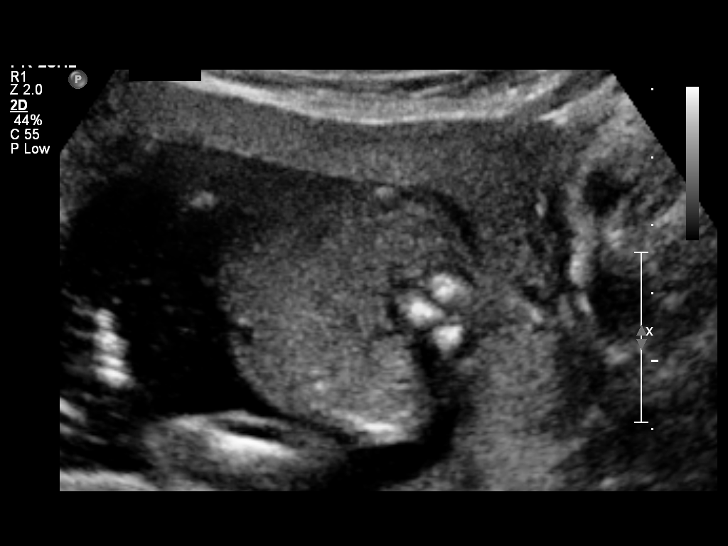
[im 97/101]
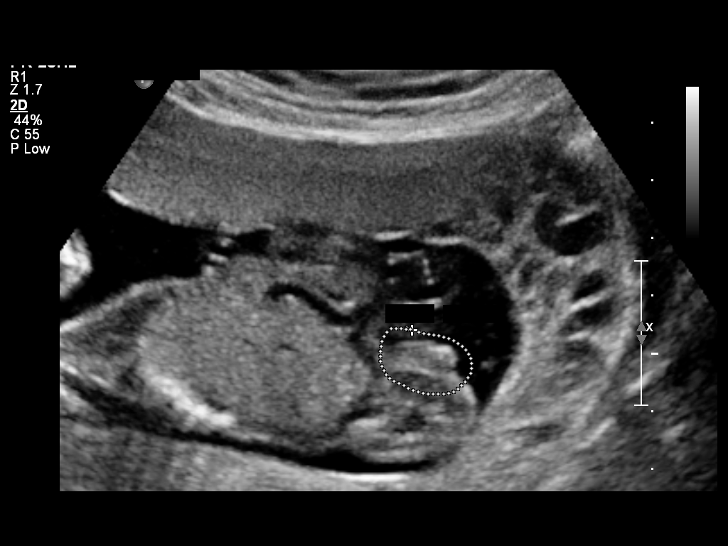

[12 of 28 positions shown; findings below may reference images not displayed]

OBSTETRICS REPORT
                      (Signed Final [DATE] [DATE])

 Order#:         [PHONE_NUMBER]_O
Procedures

 US OB DETAIL + 14 WK                                  76811.0
Indications

 Detailed fetal anatomic survey                        655.83 [KV]
Fetal Evaluation

 Fetal Heart Rate:  155                         bpm
 Cardiac Activity:  Observed
 Presentation:      Breech
 Placenta:          Right lateral, above
                    cervical os
 P. Cord            Visualized
 Insertion:

 Amniotic Fluid
 AFI FV:      Subjectively within normal limits
                                             Larg Pckt:     5.1  cm
Biometry

 BPD:     41.1  mm    G. Age:   18w 3d                CI:        76.03   70 - 86
                                                      FL/HC:      17.7   15.8 -
                                                                         18
 HC:     149.4  mm    G. Age:   18w 0d       37  %    HC/AC:      1.16   1.07 -

 AC:     129.3  mm    G. Age:   18w 4d       59  %    FL/BPD:
 FL:      26.4  mm    G. Age:   18w 0d       41  %    FL/AC:      20.4   20 - 24
 HUM:     25.3  mm    G. Age:   18w 0d       49  %
 CER:     17.3  mm    G. Age:   17w 2d       29  %
 NFT:     2.72  mm

 Est. FW:     231  gm      0 lb 8 oz     51  %
Gestational Age

 U/S Today:     18w 2d                                        EDD:   [DATE]
 Best:          18w 1d    Det. By:   U/S C R L ([DATE])     EDD:   [DATE]
Anatomy
 Cranium:           Appears normal      Aortic Arch:       Appears normal
 Fetal Cavum:       Appears normal      Ductal Arch:       Appears normal
 Ventricles:        Appears normal      Diaphragm:         Appears normal
 Choroid Plexus:    Appears normal      Stomach:           Appears normal
 Cerebellum:        Appears normal      Abdomen:           Appears normal
 Posterior Fossa:   Appears normal      Abdominal Wall:    Appears nml
                                                           (cord insert,
                                                           abd wall)
 Nuchal Fold:       Appears normal      Cord Vessels:      Appears normal
                    (neck, nuchal                          (3 vessel cord)
                    fold)
 Face:              Appears normal      Kidneys:           Appear normal
                    (lips/profile/orbit
                    s)
 Heart:             Appears normal      Bladder:           Appears normal
                    (4 chamber &
                    axis)
 RVOT:              Appears normal      Spine:             Appears normal
 LVOT:              Appears normal      Limbs:             Four extremities
                                                           seen

 Other:     Nasal bone visualized. Heels and 5th digit visualized.
            Female gender.
Cervix Uterus Adnexa

 Cervical Length:   4.3       cm

 Cervix:       Normal appearance by transabdominal scan.
 Left Ovary:   Within normal limits.
 Right Ovary:  Within normal limits.
Impression

 Siup demonstrating an EGA by ultrasound of 18w 2d. This
 corresponds well with expected EGA by early ultrasound of
 18w 1d.

 No focal fetal or placental abnormalities are noted with a
 good anatomic evaluation possible. No soft markers for Down
 Syndrome are seen. Given the expected age at delivery of
 25, today's normal ultrasound would decrease the age related
 risk for Down Syndrome from [DATE] to [DATE] (DELOWR et al).
 Correlation with other aneuploidy screening results, if
 available, would be recommended for a more complete risk
 assessment.

 Subjectively and quantitatively normal amniotic fluid volume.
 Normal cervical length.

 questions or concerns.

## 2011-11-25 ENCOUNTER — Encounter (HOSPITAL_COMMUNITY): Payer: Self-pay

## 2011-11-25 ENCOUNTER — Inpatient Hospital Stay (HOSPITAL_COMMUNITY)
Admission: AD | Admit: 2011-11-25 | Discharge: 2011-11-25 | Disposition: A | Discharge status: Home / Self Care | Payer: Medicaid Other | Source: Ambulatory Visit | Attending: Obstetrics & Gynecology | Admitting: Obstetrics & Gynecology

## 2011-11-25 DIAGNOSIS — O4700 False labor before 37 completed weeks of gestation, unspecified trimester: Secondary | ICD-10-CM

## 2011-11-25 HISTORY — DX: Major depressive disorder, single episode, unspecified: F32.9

## 2011-11-25 HISTORY — DX: Depression, unspecified: F32.A

## 2011-11-25 HISTORY — DX: Migraine, unspecified, not intractable, without status migrainosus: G43.909

## 2011-11-25 HISTORY — DX: Reserved for concepts with insufficient information to code with codable children: IMO0002

## 2011-11-25 HISTORY — DX: Other reaction to spinal and lumbar puncture: G97.1

## 2011-11-25 HISTORY — DX: Unspecified abnormal cytological findings in specimens from cervix uteri: R87.619

## 2011-11-25 HISTORY — DX: Respiratory tuberculosis unspecified: A15.9

## 2011-11-25 LAB — WET PREP, GENITAL
Clue Cells Wet Prep HPF POC: NONE SEEN
Trich, Wet Prep: NONE SEEN
Yeast Wet Prep HPF POC: NONE SEEN

## 2011-11-25 LAB — URINALYSIS, ROUTINE W REFLEX MICROSCOPIC
Bilirubin Urine: NEGATIVE
Ketones, ur: NEGATIVE mg/dL
Protein, ur: NEGATIVE mg/dL
Urobilinogen, UA: 0.2 mg/dL (ref 0.0–1.0)
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
## 2011-11-25 NOTE — MAU Note (Signed)
Having some ctxs for last or so. Had some ctxs Thurs night. Hips and legs hurt

## 2011-11-25 NOTE — MAU Note (Signed)
Patient is in with c/o intermittent contractions, pelvic pressure, hip/leg pain since Thursday. 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 She denies any vaginal bleeding or lof. Reports good fetal movement and intercourse this morning.

## 2011-11-27 LAB — GC/CHLAMYDIA PROBE AMP, GENITAL: Chlamydia, DNA Probe: NEGATIVE

## 2011-11-28 NOTE — MAU Provider Note (Signed)
Chart reviewed.  Agree with above.  Brigett Estell L. Harraway-Smith, M.D., FACOG  

## 2012-02-14 ENCOUNTER — Other Ambulatory Visit (HOSPITAL_COMMUNITY): Payer: Self-pay | Admitting: Physician Assistant

## 2012-02-14 DIAGNOSIS — O365990 Maternal care for other known or suspected poor fetal growth, unspecified trimester, not applicable or unspecified: Secondary | ICD-10-CM

## 2012-02-15 ENCOUNTER — Ambulatory Visit (HOSPITAL_COMMUNITY)
Admission: RE | Admit: 2012-02-15 | Discharge: 2012-02-15 | Disposition: A | Discharge status: Home / Self Care | Payer: Medicaid Other | Source: Ambulatory Visit | Attending: Physician Assistant | Admitting: Physician Assistant

## 2012-02-15 DIAGNOSIS — O365990 Maternal care for other known or suspected poor fetal growth, unspecified trimester, not applicable or unspecified: Secondary | ICD-10-CM | POA: Insufficient documentation

## 2012-02-15 IMAGING — US US OB FOLLOW-UP
1 series · 12 of 28 positions shown · non-contrast
Comparison: none

[Series 1: us ob follow up · 12 of 31 slices shown]
[im 2/31]
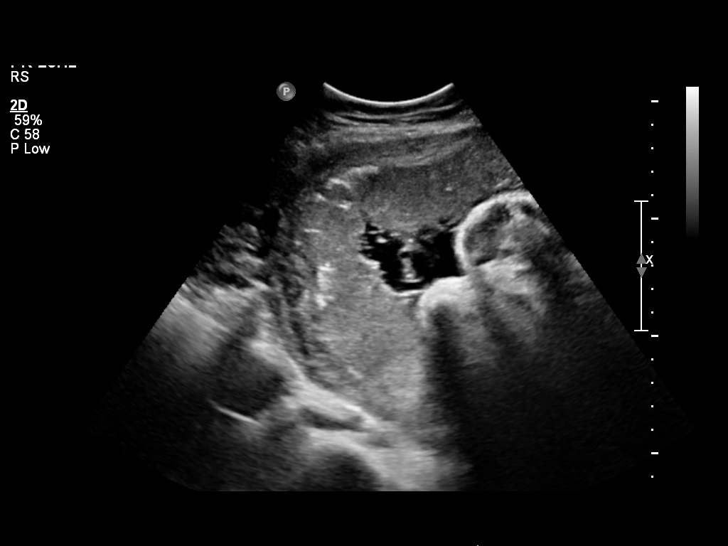
[im 4/31]
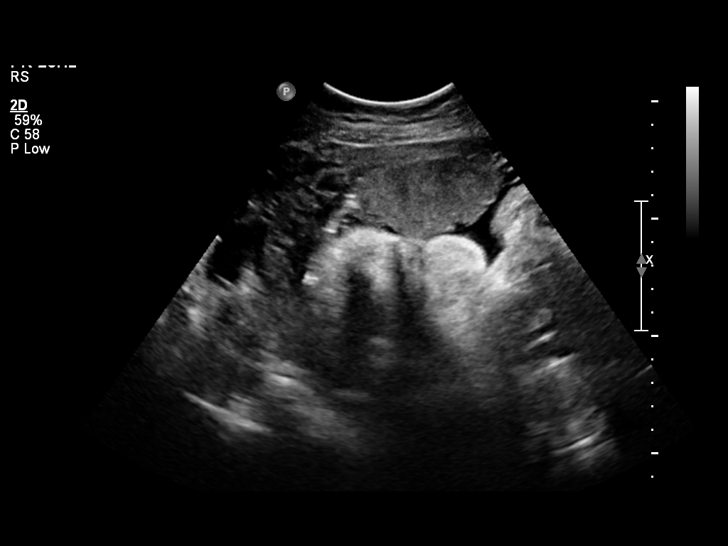
[im 6/31]
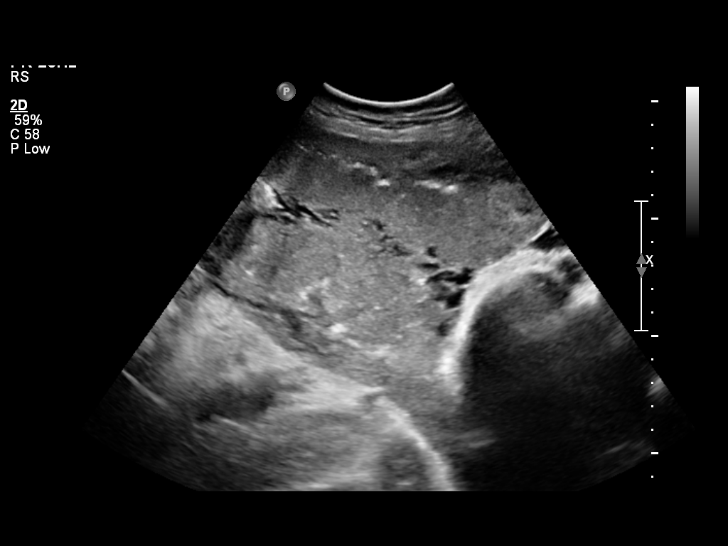
[im 9/31]
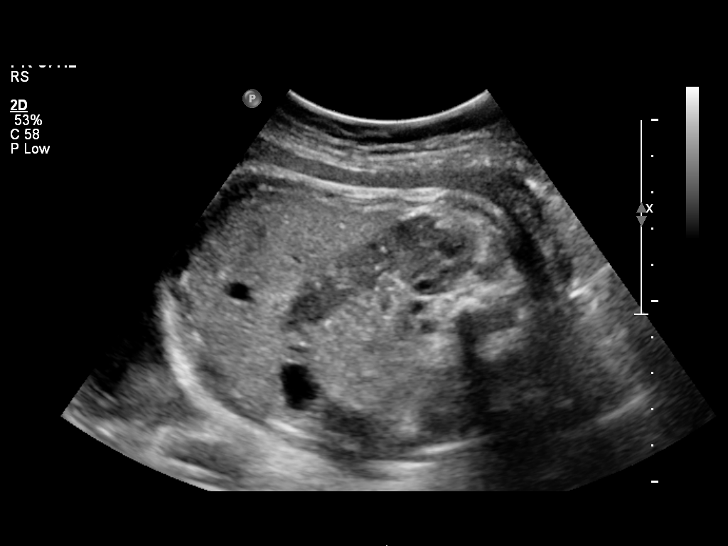
[im 12/31]
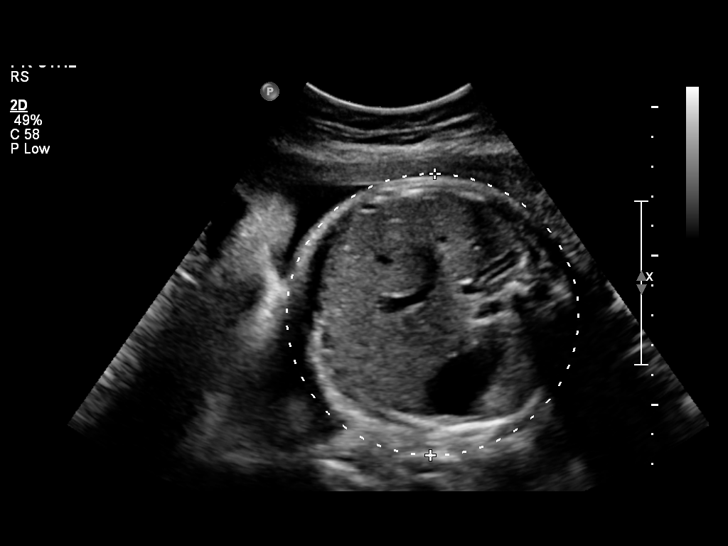
[im 14/31]
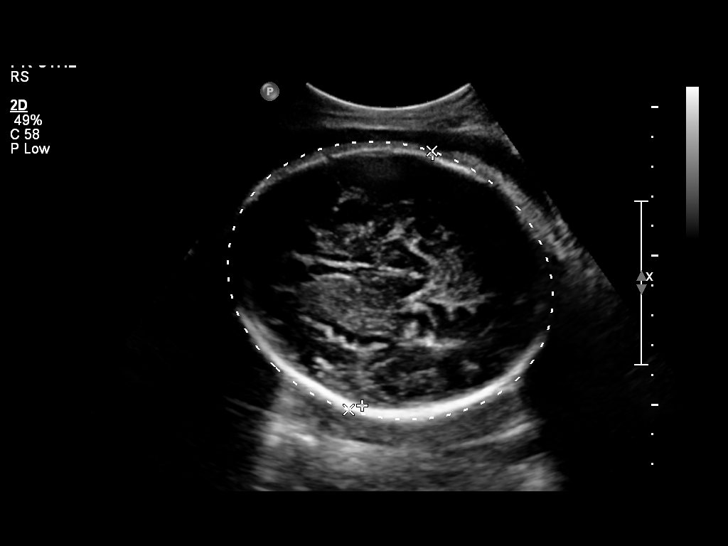
[im 17/31]
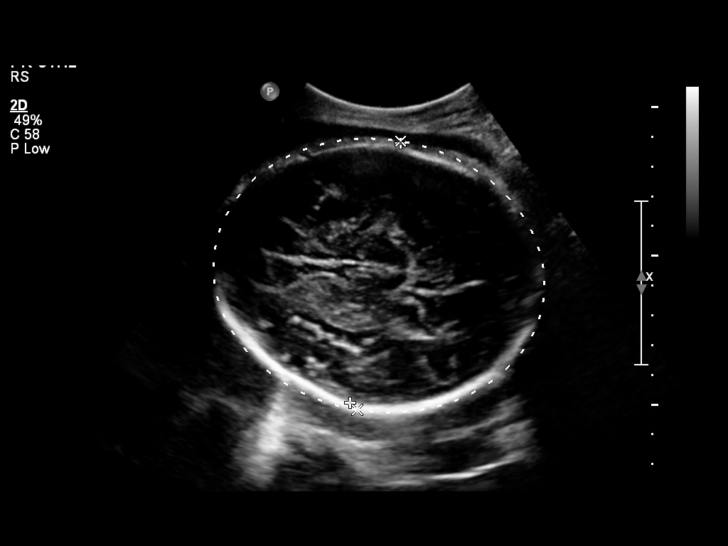
[im 19/31]
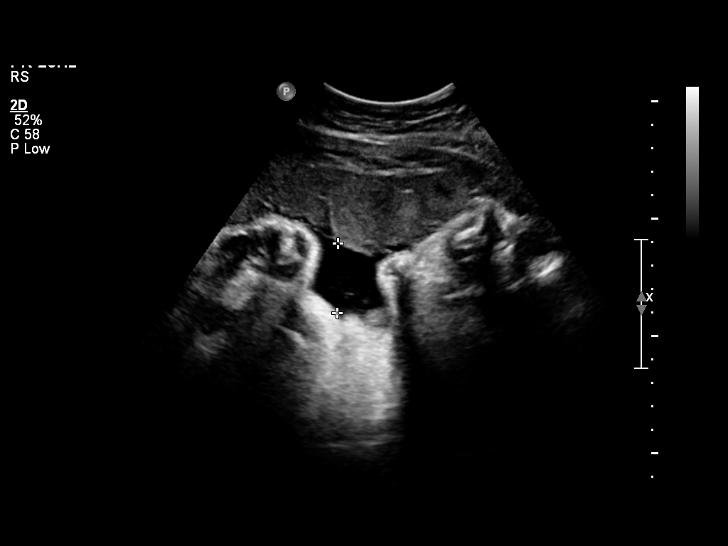
[im 22/31]
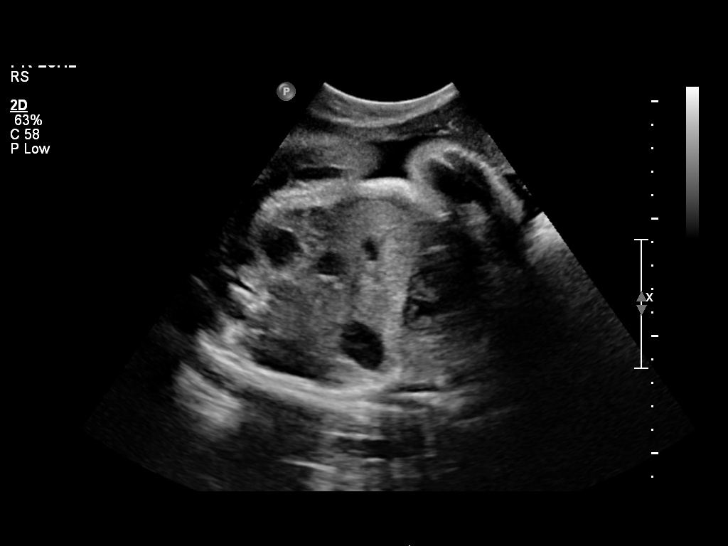
[im 25/31]
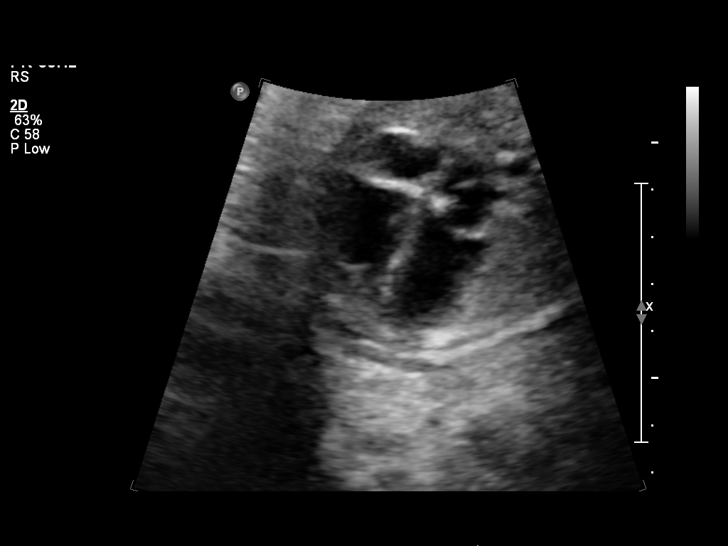
[im 27/31]
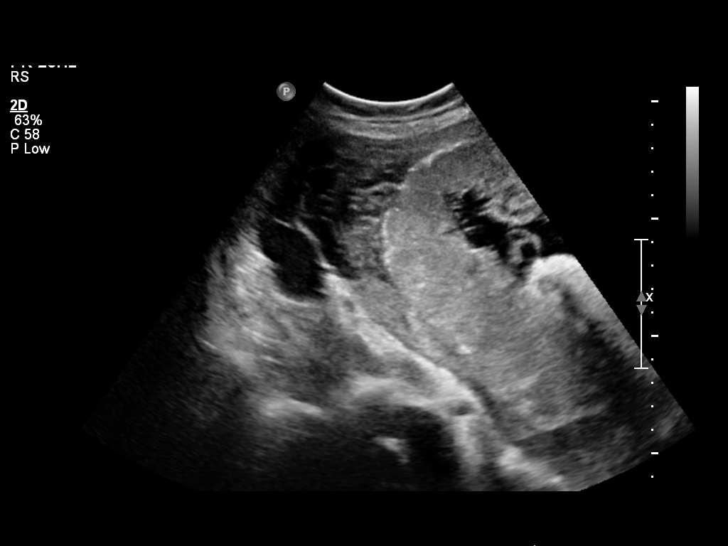
[im 29/31]
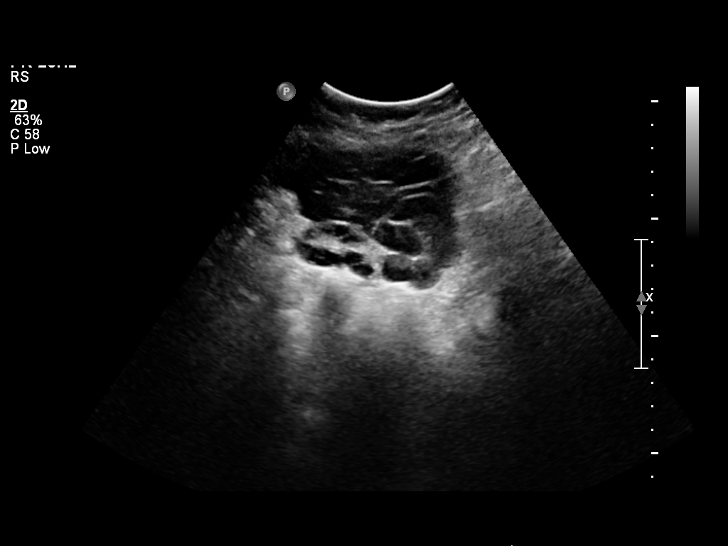

[12 of 28 positions shown; findings below may reference images not displayed]

OBSTETRICS REPORT
                      (Signed Final [DATE] [DATE])

Service(s) Provided

 US OB FOLLOW UP                                       76816.1
Indications

 Size less than dates (Small for gestational [AGE]
 FGR)
Fetal Evaluation

 Num Of Fetuses:    1
 Fetal Heart Rate:  140                         bpm
 Cardiac Activity:  Observed
 Presentation:      Cephalic
 Placenta:          Right lateral, above
                    cervical os

 Amniotic Fluid
 AFI FV:      Subjectively low-normal
 AFI Sum:     9.96    cm      22   %Tile     Larg Pckt:     4.1  cm
 RUQ:   4.1    cm    RLQ:    2.96   cm    LUQ:   1.75    cm   LLQ:    1.15   cm
Biometry

 BPD:     87.9  mm    G. Age:   35w 4d                CI:        76.29   70 - 86
                                                      FL/HC:      20.9   20.1 -

 HC:     318.9  mm    G. Age:   35w 6d       19  %    HC/AC:      1.06   0.93 -

 AC:     299.8  mm    G. Age:   34w 0d       10  %    FL/BPD:     75.8   71 - 87
 FL:      66.6  mm    G. Age:   34w 2d       10  %    FL/AC:      22.2   20 - 24

 Est. FW:    [GJ]  gm      5 lb 5 oz     22  %
Gestational Age

 U/S Today:     35w 0d                                        EDD:   [DATE]
 Best:          36w 0d    Det. By:   U/S C R L ([DATE])     EDD:   [DATE]
Anatomy

 Cranium:          Appears normal         Aortic Arch:      Previously seen
 Fetal Cavum:      Appears normal         Ductal Arch:      Previously seen
 Ventricles:       Appears normal         Diaphragm:        Appears normal
 Choroid Plexus:   Previously seen        Stomach:          Appears normal
 Cerebellum:       Previously seen        Abdomen:          Appears normal
 Posterior Fossa:  Previously seen        Abdominal Wall:   Previously seen
 Nuchal Fold:      Previously seen        Cord Vessels:     Previously seen
 Face:             Previious seen         Kidneys:          Appear normal
 Heart:            Appears normal (4      Bladder:          Appears normal
                   chamber & axis)
 RVOT:             Previously seen        Spine:            Previously seen
 LVOT:             Previously seen        Limbs:            Four extremities
                                                            seen

 Other:  Female gender. Heels and 5th digit previously seen.
Cervix Uterus Adnexa

 Cervix:       Not visualized (advanced GA >34 wks)
 Left Ovary:   Previously seen.
 Right Ovary:  Previously seen
 Adnexa:     No abnormality visualized.
Impression

 Single intrauterine gestation demonstrating an estimated
 gestational age by ultrasound of 35w 0d. This is correlated
 with expected estimated gestational age by early ultrasound
 of 36w 0d. EFW is currently at the 22% and continued close
 follow up for growth is recommended.

 No late developing fetal anatomic abnormalities are noted
 associated with the lateral ventricles, four chamber heart,
 stomach, kidneys or bladder.

 Subjectively and quantitatively low normal amniotic fluid
 volume with an AFI at the 22%.

 questions or concerns.

## 2012-02-23 ENCOUNTER — Other Ambulatory Visit (HOSPITAL_COMMUNITY): Payer: Self-pay | Admitting: Family

## 2012-02-23 DIAGNOSIS — O288 Other abnormal findings on antenatal screening of mother: Secondary | ICD-10-CM

## 2012-02-23 DIAGNOSIS — IMO0002 Reserved for concepts with insufficient information to code with codable children: Secondary | ICD-10-CM

## 2012-03-07 ENCOUNTER — Ambulatory Visit (HOSPITAL_COMMUNITY)
Admission: RE | Admit: 2012-03-07 | Discharge: 2012-03-07 | Disposition: A | Discharge status: Home / Self Care | Payer: Medicaid Other | Source: Ambulatory Visit | Attending: Family | Admitting: Family

## 2012-03-07 DIAGNOSIS — O365990 Maternal care for other known or suspected poor fetal growth, unspecified trimester, not applicable or unspecified: Secondary | ICD-10-CM | POA: Insufficient documentation

## 2012-03-07 DIAGNOSIS — IMO0002 Reserved for concepts with insufficient information to code with codable children: Secondary | ICD-10-CM

## 2012-03-07 IMAGING — US US OB FOLLOW-UP
1 series · 12 of 15 positions shown · non-contrast
Comparison: none

[Series 1: us ob follow up · 15 acquisitions, 12 frames shown]
[im 1/15]
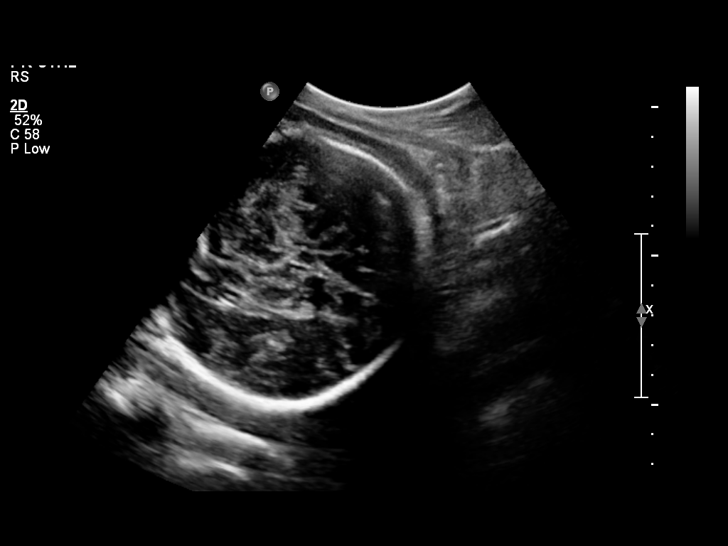
[im 2/15]
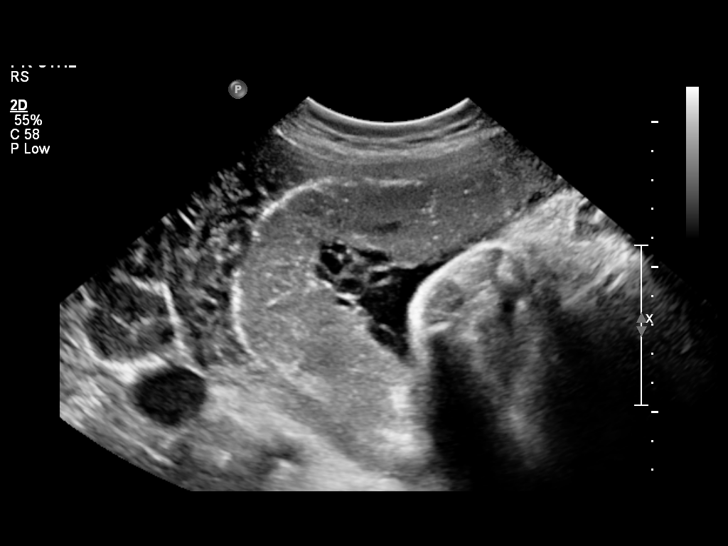
[im 4/15]
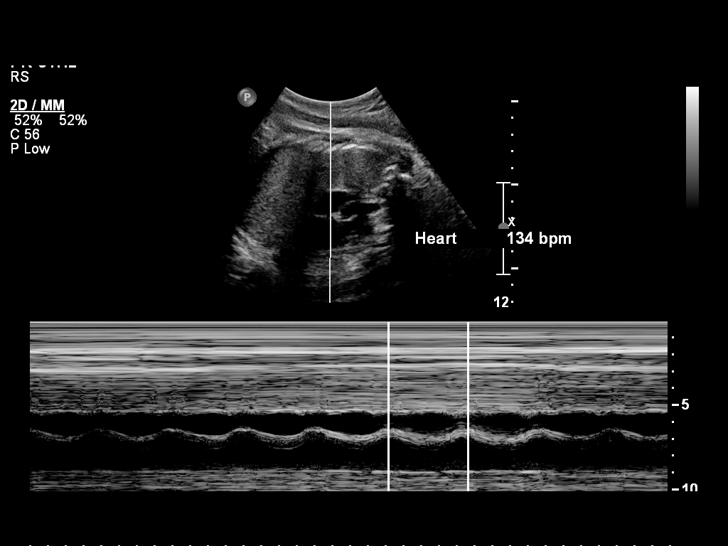
[im 5/15]
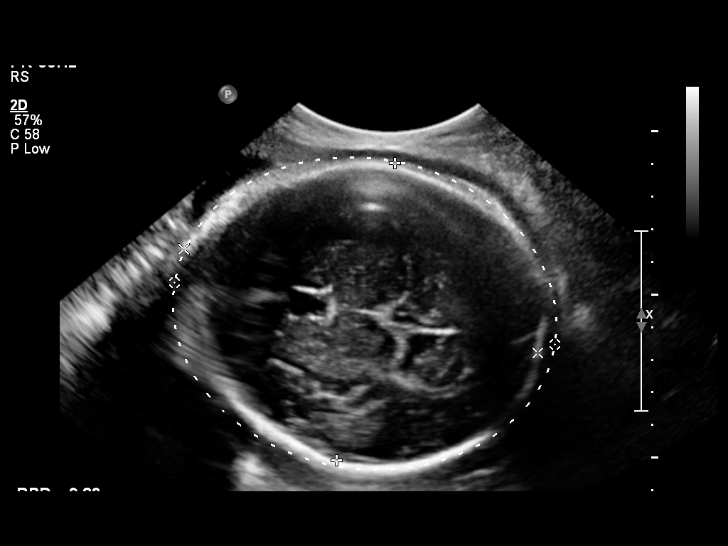
[im 6/15]
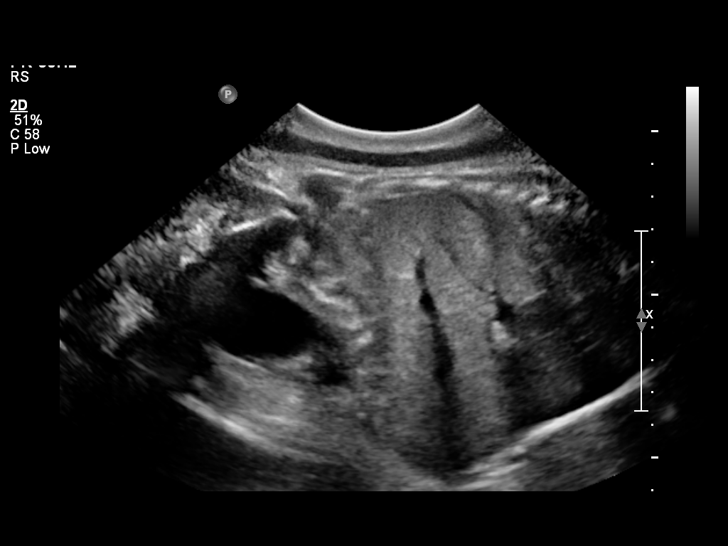
[im 7/15]
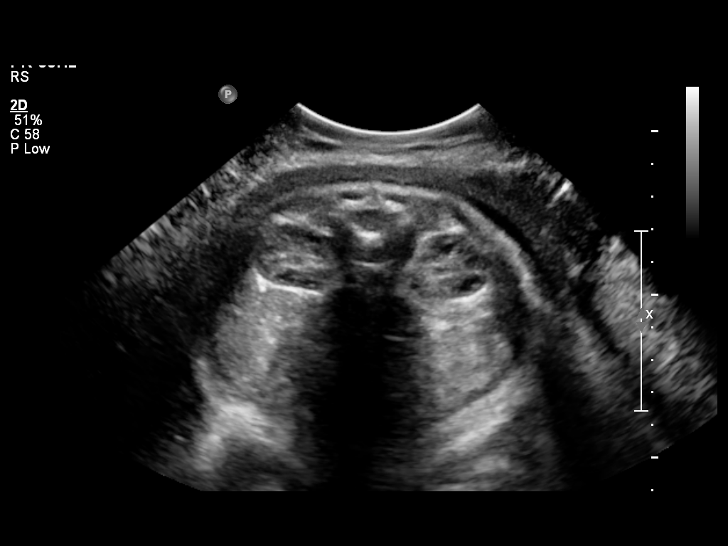
[im 9/15]
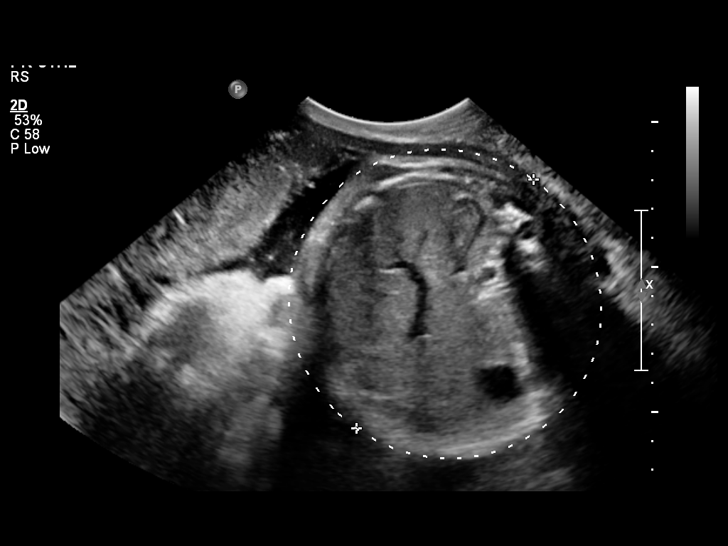
[im 10/15]
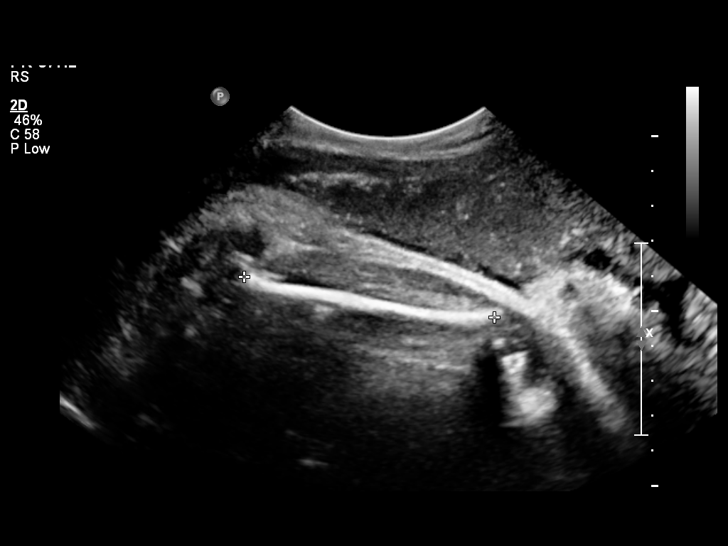
[im 11/15]
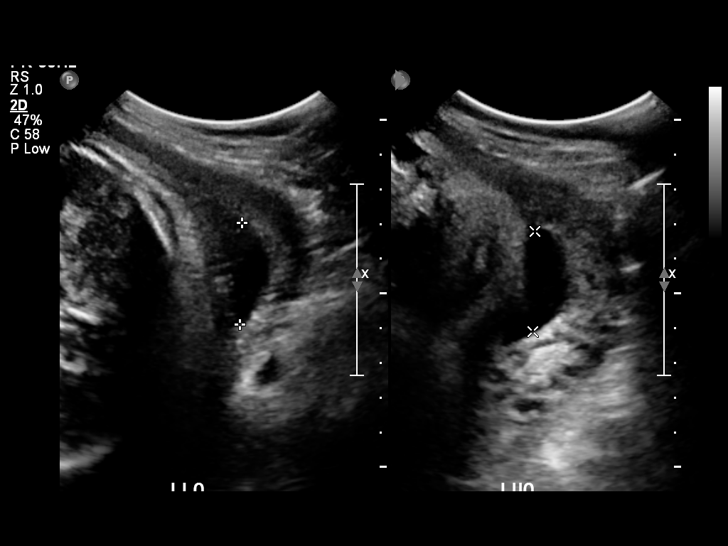
[im 12/15]
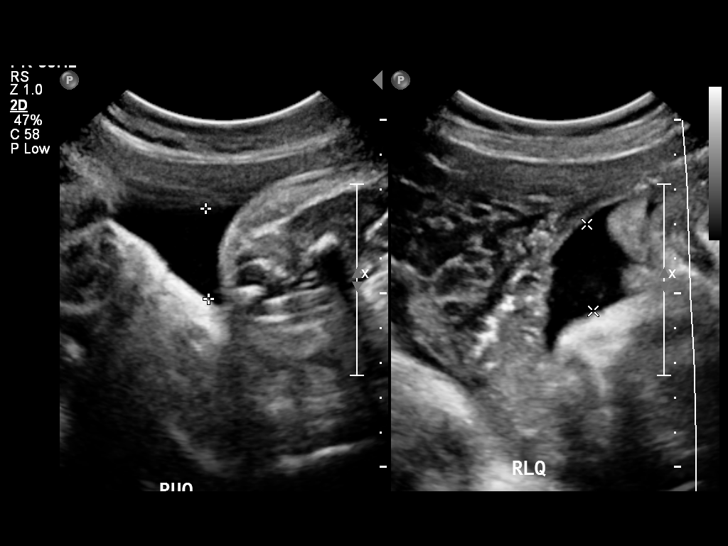
[im 14/15]
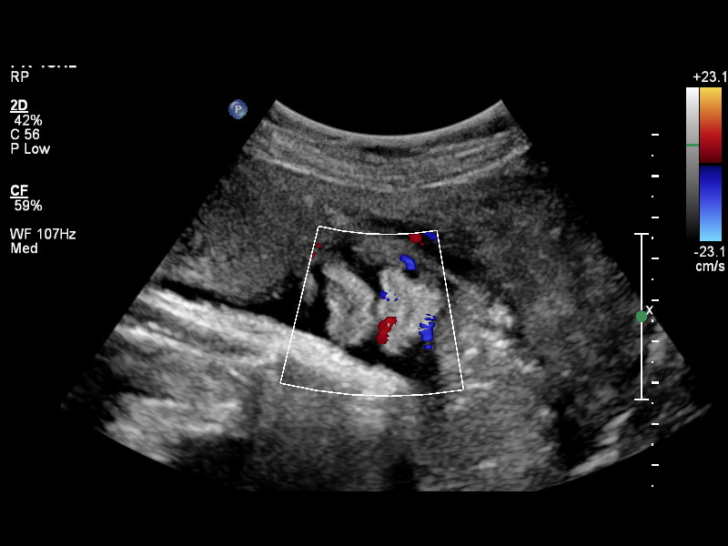
[im 15/15]
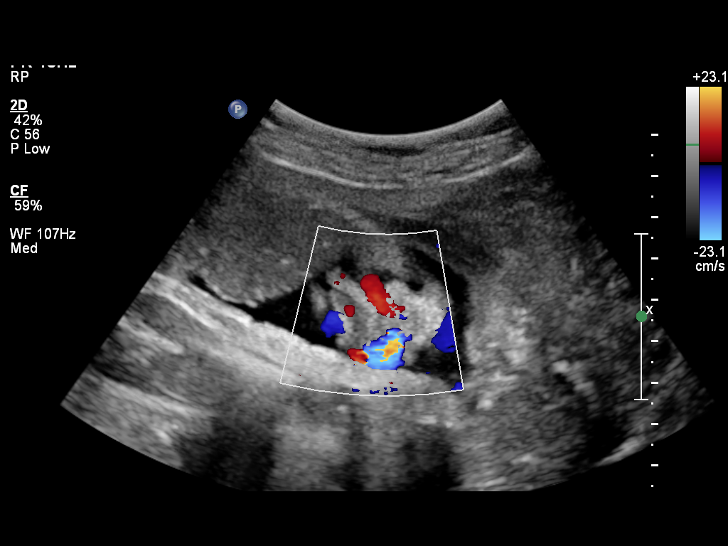

[12 of 15 positions shown; findings below may reference images not displayed]

OBSTETRICS REPORT
                      (Signed Final [DATE] [DATE])

Service(s) Provided

 US OB FOLLOW UP                                       76816.1
Indications

 Size less than dates (Small for gestational [AGE]
 FGR)
Fetal Evaluation

 Num Of Fetuses:    1
 Fetal Heart Rate:  134                         bpm
 Cardiac Activity:  Observed
 Presentation:      Cephalic
 Placenta:          Right lateral, above
                    cervical os

 Amniotic Fluid
 AFI FV:      Subjectively within normal limits
 AFI Sum:     10.86   cm      35   %Tile     Larg Pckt:   2.91   cm
 RUQ:   2.59   cm    RLQ:    2.49   cm    LUQ:   2.87    cm   LLQ:    2.91   cm
Biometry

 BPD:     92.2  mm    G. Age:   37w 3d                CI:         82.0   70 - 86
 OFD:    112.5  mm                                    FL/HC:      21.4   20.6 -

 HC:     337.4  mm    G. Age:   38w 5d       31  %    HC/AC:      0.99   0.87 -

 AC:     339.2  mm    G. Age:   37w 6d       39  %    FL/BPD:     78.4   71 - 87
 FL:      72.3  mm    G. Age:   37w 0d       13  %    FL/AC:      21.3   20 - 24

 Est. FW:    [3R]  gm      7 lb 4 oz     59  %
Gestational Age

 Clinical EDD:  39w 0d                                        EDD:   [DATE]
 U/S Today:     37w 5d                                        EDD:   [DATE]
 Best:          39w 0d    Det. By:   U/S C R L ([DATE])     EDD:   [DATE]
Anatomy

 Cranium:          Appears normal         Aortic Arch:      Previously seen
 Fetal Cavum:      Appears normal         Ductal Arch:      Previously seen
 Ventricles:       Previously seen        Diaphragm:        Appears normal
 Choroid Plexus:   Previously seen        Stomach:          Appears normal, left
                                                            sided
 Cerebellum:       Previously seen        Abdomen:          Appears normal
 Posterior Fossa:  Previously seen        Abdominal Wall:   Previously seen
 Nuchal Fold:      Previously seen        Cord Vessels:     Previously seen
 Face:             Orbits and profile     Kidneys:          Appear normal
                   previously seen
 Lips:             Previously seen        Bladder:          Appears normal
 Palate:           Previously seen        Spine:            Previously seen
 Heart:            Previously seen        Lower             Previously seen
                                          Extremities:
 RVOT:             Previously seen        Upper             Previously seen
                                          Extremities:
 LVOT:             Previously seen
Cervix Uterus Adnexa

 Cervix:       Not visualized (advanced GA >34 wks)
Impression

 Single intrauterine gestation demonstrating an estimated
 gestational age by ultrasound of 37w 5d. This is correlated
 with expected estimated gestational age by early ultrasound
 of 39w 0d. EFW is currently at the 59%.

 No late developing fetal anatomic abnormalities are noted
 associated with the stomach, kidneys or bladder. The four
 chamber heart and lateral ventricles could not be well
 assessed due to fetal positioning combined with advanced
 gestational age and maternal body habitus.

 Subjectively and quantitatively normal amniotic fluid volume.

 questions or concerns.

## 2012-03-14 ENCOUNTER — Other Ambulatory Visit (HOSPITAL_COMMUNITY): Payer: Self-pay | Admitting: Nurse Practitioner

## 2012-03-14 ENCOUNTER — Encounter (HOSPITAL_COMMUNITY): Payer: Self-pay | Admitting: *Deleted

## 2012-03-14 ENCOUNTER — Telehealth (HOSPITAL_COMMUNITY): Payer: Self-pay | Admitting: *Deleted

## 2012-03-14 DIAGNOSIS — O480 Post-term pregnancy: Secondary | ICD-10-CM

## 2012-03-14 NOTE — Telephone Encounter (Signed)
Preadmission screen  

## 2012-03-18 ENCOUNTER — Ambulatory Visit (HOSPITAL_COMMUNITY): Admission: RE | Admit: 2012-03-18 | Payer: Medicaid Other | Source: Ambulatory Visit

## 2012-03-21 ENCOUNTER — Ambulatory Visit (HOSPITAL_COMMUNITY)
Admission: RE | Admit: 2012-03-21 | Discharge: 2012-03-21 | Disposition: A | Discharge status: Home / Self Care | Payer: Medicaid Other | Source: Ambulatory Visit | Attending: Nurse Practitioner | Admitting: Nurse Practitioner

## 2012-03-21 DIAGNOSIS — O480 Post-term pregnancy: Secondary | ICD-10-CM | POA: Insufficient documentation

## 2012-03-21 DIAGNOSIS — O99330 Smoking (tobacco) complicating pregnancy, unspecified trimester: Secondary | ICD-10-CM | POA: Insufficient documentation

## 2012-03-23 ENCOUNTER — Inpatient Hospital Stay (HOSPITAL_COMMUNITY)
Admission: AD | Admit: 2012-03-23 | Discharge: 2012-03-25 | DRG: 767 | Disposition: A | Discharge status: Home / Self Care | Payer: Medicaid Other | Source: Ambulatory Visit | Attending: Obstetrics & Gynecology | Admitting: Obstetrics & Gynecology

## 2012-03-23 DIAGNOSIS — O480 Post-term pregnancy: Principal | ICD-10-CM | POA: Diagnosis present

## 2012-03-23 DIAGNOSIS — IMO0001 Reserved for inherently not codable concepts without codable children: Secondary | ICD-10-CM

## 2012-03-23 DIAGNOSIS — Z302 Encounter for sterilization: Secondary | ICD-10-CM

## 2012-03-23 NOTE — MAU Note (Signed)
Patient is brought in by ems with c/o srom at 0010am and contractions. She reports good fetal movement.

## 2012-03-24 ENCOUNTER — Encounter (HOSPITAL_COMMUNITY)
Admission: AD | Disposition: A | Discharge status: Home / Self Care | Payer: Self-pay | Source: Ambulatory Visit | Attending: Obstetrics & Gynecology

## 2012-03-24 ENCOUNTER — Inpatient Hospital Stay (HOSPITAL_COMMUNITY): Payer: Medicaid Other | Admitting: Anesthesiology

## 2012-03-24 ENCOUNTER — Encounter (HOSPITAL_COMMUNITY): Payer: Self-pay | Admitting: Anesthesiology

## 2012-03-24 ENCOUNTER — Inpatient Hospital Stay (HOSPITAL_COMMUNITY): Admission: RE | Admit: 2012-03-24 | Payer: Medicaid Other | Source: Ambulatory Visit

## 2012-03-24 ENCOUNTER — Encounter (HOSPITAL_COMMUNITY): Payer: Self-pay | Admitting: *Deleted

## 2012-03-24 DIAGNOSIS — O0940 Supervision of pregnancy with grand multiparity, unspecified trimester: Secondary | ICD-10-CM

## 2012-03-24 DIAGNOSIS — Z302 Encounter for sterilization: Secondary | ICD-10-CM

## 2012-03-24 DIAGNOSIS — O480 Post-term pregnancy: Secondary | ICD-10-CM

## 2012-03-24 HISTORY — PX: TUBAL LIGATION: SHX77

## 2012-03-24 LAB — CBC
HCT: 37.6 % (ref 36.0–46.0)
Hemoglobin: 12.9 g/dL (ref 12.0–15.0)
RBC: 4.14 MIL/uL (ref 3.87–5.11)
WBC: 11.5 10*3/uL — ABNORMAL HIGH (ref 4.0–10.5)

## 2012-03-24 LAB — SURGICAL PCR SCREEN
MRSA, PCR: INVALID — AB
Staphylococcus aureus: INVALID — AB

## 2012-03-24 LAB — ABO/RH: ABO/RH(D): A POS

## 2012-03-24 LAB — TYPE AND SCREEN

## 2012-03-24 LAB — RPR: RPR Ser Ql: NONREACTIVE

## 2012-03-24 SURGERY — LIGATION, FALLOPIAN TUBE, POSTPARTUM
Anesthesia: Epidural | Site: Abdomen | Laterality: Bilateral | Wound class: Clean

## 2012-03-24 MED ORDER — FENTANYL CITRATE 0.05 MG/ML IJ SOLN: 25.0000 ug | INTRAMUSCULAR | Status: DC | PRN

## 2012-03-24 MED ORDER — FAMOTIDINE 20 MG PO TABS
40.0000 mg | ORAL_TABLET | Freq: Once | ORAL | Status: AC
  Administered 2012-03-24: 40 mg via ORAL
  Filled 2012-03-24: qty 2

## 2012-03-24 MED ORDER — LACTATED RINGERS IV SOLN
500.0000 mL | Freq: Once | INTRAVENOUS | Status: AC
  Administered 2012-03-24: 500 mL via INTRAVENOUS

## 2012-03-24 MED ORDER — METOCLOPRAMIDE HCL 5 MG/ML IJ SOLN: 10.0000 mg | Freq: Once | INTRAMUSCULAR | Status: DC | PRN

## 2012-03-24 MED ORDER — LACTATED RINGERS IV SOLN
INTRAVENOUS | Status: DC
  Administered 2012-03-24: 08:00:00 via INTRAVENOUS

## 2012-03-24 MED ORDER — ONDANSETRON HCL 4 MG/2ML IJ SOLN
INTRAMUSCULAR | Status: DC | PRN
  Administered 2012-03-24: 4 mg via INTRAVENOUS

## 2012-03-24 MED ORDER — OXYCODONE-ACETAMINOPHEN 5-325 MG PO TABS
1.0000 | ORAL_TABLET | ORAL | Status: DC | PRN
  Administered 2012-03-24 – 2012-03-25 (×3): 1 via ORAL
  Filled 2012-03-24 (×4): qty 1

## 2012-03-24 MED ORDER — SODIUM BICARBONATE 8.4 % IV SOLN
INTRAVENOUS | Status: DC | PRN
  Administered 2012-03-24: 3 mL via EPIDURAL

## 2012-03-24 MED ORDER — FENTANYL CITRATE 0.05 MG/ML IJ SOLN
INTRAMUSCULAR | Status: AC
  Filled 2012-03-24: qty 2

## 2012-03-24 MED ORDER — PHENYLEPHRINE 40 MCG/ML (10ML) SYRINGE FOR IV PUSH (FOR BLOOD PRESSURE SUPPORT): 80.0000 ug | PREFILLED_SYRINGE | INTRAVENOUS | Status: DC | PRN

## 2012-03-24 MED ORDER — PROPOFOL 10 MG/ML IV BOLUS
INTRAVENOUS | Status: DC | PRN
  Administered 2012-03-24 (×2): 10 mg via INTRAVENOUS

## 2012-03-24 MED ORDER — FLEET ENEMA 7-19 GM/118ML RE ENEM: 1.0000 | ENEMA | RECTAL | Status: DC | PRN

## 2012-03-24 MED ORDER — FENTANYL 2.5 MCG/ML BUPIVACAINE 1/10 % EPIDURAL INFUSION (WH - ANES)
14.0000 mL/h | INTRAMUSCULAR | Status: DC
  Administered 2012-03-24: 14 mL/h via EPIDURAL
  Filled 2012-03-24: qty 125

## 2012-03-24 MED ORDER — SENNOSIDES-DOCUSATE SODIUM 8.6-50 MG PO TABS
2.0000 | ORAL_TABLET | Freq: Every day | ORAL | Status: DC
  Administered 2012-03-24: 2 via ORAL

## 2012-03-24 MED ORDER — FENTANYL CITRATE 0.05 MG/ML IJ SOLN: 50.0000 ug | INTRAMUSCULAR | Status: DC | PRN

## 2012-03-24 MED ORDER — OXYTOCIN 40 UNITS IN LACTATED RINGERS INFUSION - SIMPLE MED: 62.5000 mL/h | INTRAVENOUS | Status: DC | PRN

## 2012-03-24 MED ORDER — CHLOROPROCAINE HCL 3 % IJ SOLN
INTRAMUSCULAR | Status: AC
  Filled 2012-03-24: qty 20

## 2012-03-24 MED ORDER — EPHEDRINE 5 MG/ML INJ
10.0000 mg | INTRAVENOUS | Status: DC | PRN
  Filled 2012-03-24: qty 4

## 2012-03-24 MED ORDER — HYDROXYZINE HCL 50 MG/ML IM SOLN: 50.0000 mg | Freq: Four times a day (QID) | INTRAMUSCULAR | Status: DC | PRN

## 2012-03-24 MED ORDER — BISACODYL 10 MG RE SUPP: 10.0000 mg | Freq: Every day | RECTAL | Status: DC | PRN

## 2012-03-24 MED ORDER — OXYTOCIN BOLUS FROM INFUSION: 500.0000 mL | INTRAVENOUS | Status: DC

## 2012-03-24 MED ORDER — EPHEDRINE 5 MG/ML INJ: 10.0000 mg | INTRAVENOUS | Status: DC | PRN

## 2012-03-24 MED ORDER — OXYTOCIN 40 UNITS IN LACTATED RINGERS INFUSION - SIMPLE MED
62.5000 mL/h | INTRAVENOUS | Status: DC
  Administered 2012-03-24: 62.5 mL/h via INTRAVENOUS
  Filled 2012-03-24: qty 1000

## 2012-03-24 MED ORDER — PRENATAL MULTIVITAMIN CH
1.0000 | ORAL_TABLET | Freq: Every day | ORAL | Status: DC
  Administered 2012-03-25: 1 via ORAL
  Filled 2012-03-24 (×2): qty 1

## 2012-03-24 MED ORDER — ONDANSETRON HCL 4 MG/2ML IJ SOLN: 4.0000 mg | Freq: Four times a day (QID) | INTRAMUSCULAR | Status: DC | PRN

## 2012-03-24 MED ORDER — FENTANYL CITRATE 0.05 MG/ML IJ SOLN
INTRAMUSCULAR | Status: DC | PRN
  Administered 2012-03-24 (×2): 50 ug via INTRAVENOUS

## 2012-03-24 MED ORDER — OXYCODONE-ACETAMINOPHEN 5-325 MG PO TABS: 1.0000 | ORAL_TABLET | ORAL | Status: DC | PRN

## 2012-03-24 MED ORDER — METOCLOPRAMIDE HCL 10 MG PO TABS
10.0000 mg | ORAL_TABLET | Freq: Once | ORAL | Status: AC
  Administered 2012-03-24: 10 mg via ORAL
  Filled 2012-03-24: qty 1

## 2012-03-24 MED ORDER — ONDANSETRON HCL 4 MG/2ML IJ SOLN: 4.0000 mg | INTRAMUSCULAR | Status: DC | PRN

## 2012-03-24 MED ORDER — MEPERIDINE HCL 25 MG/ML IJ SOLN: 6.2500 mg | INTRAMUSCULAR | Status: DC | PRN

## 2012-03-24 MED ORDER — MIDAZOLAM HCL 2 MG/2ML IJ SOLN
INTRAMUSCULAR | Status: AC
  Filled 2012-03-24: qty 2

## 2012-03-24 MED ORDER — LIDOCAINE HCL (PF) 1 % IJ SOLN
30.0000 mL | INTRAMUSCULAR | Status: DC | PRN
  Filled 2012-03-24: qty 30

## 2012-03-24 MED ORDER — SODIUM CHLORIDE 0.9 % IJ SOLN: 3.0000 mL | INTRAMUSCULAR | Status: DC | PRN

## 2012-03-24 MED ORDER — MEASLES, MUMPS & RUBELLA VAC ~~LOC~~ INJ
0.5000 mL | INJECTION | Freq: Once | SUBCUTANEOUS | Status: DC
  Filled 2012-03-24: qty 0.5

## 2012-03-24 MED ORDER — TETANUS-DIPHTH-ACELL PERTUSSIS 5-2.5-18.5 LF-MCG/0.5 IM SUSP
0.5000 mL | Freq: Once | INTRAMUSCULAR | Status: AC
  Administered 2012-03-25: 0.5 mL via INTRAMUSCULAR
  Filled 2012-03-24: qty 0.5

## 2012-03-24 MED ORDER — SIMETHICONE 80 MG PO CHEW: 80.0000 mg | CHEWABLE_TABLET | ORAL | Status: DC | PRN

## 2012-03-24 MED ORDER — WITCH HAZEL-GLYCERIN EX PADS: 1.0000 "application " | MEDICATED_PAD | CUTANEOUS | Status: DC | PRN

## 2012-03-24 MED ORDER — ONDANSETRON HCL 4 MG PO TABS: 4.0000 mg | ORAL_TABLET | ORAL | Status: DC | PRN

## 2012-03-24 MED ORDER — LIDOCAINE HCL (CARDIAC) 20 MG/ML IV SOLN
INTRAVENOUS | Status: AC
  Filled 2012-03-24: qty 5

## 2012-03-24 MED ORDER — SODIUM BICARBONATE 8.4 % IV SOLN
INTRAVENOUS | Status: AC
  Filled 2012-03-24: qty 50

## 2012-03-24 MED ORDER — BENZOCAINE-MENTHOL 20-0.5 % EX AERO: 1.0000 "application " | INHALATION_SPRAY | CUTANEOUS | Status: DC | PRN

## 2012-03-24 MED ORDER — MIDAZOLAM HCL 5 MG/5ML IJ SOLN
INTRAMUSCULAR | Status: DC | PRN
  Administered 2012-03-24 (×2): 1 mg via INTRAVENOUS

## 2012-03-24 MED ORDER — FLEET ENEMA 7-19 GM/118ML RE ENEM: 1.0000 | ENEMA | Freq: Every day | RECTAL | Status: DC | PRN

## 2012-03-24 MED ORDER — LANOLIN HYDROUS EX OINT: TOPICAL_OINTMENT | CUTANEOUS | Status: DC | PRN

## 2012-03-24 MED ORDER — SODIUM BICARBONATE 8.4 % IV SOLN
INTRAVENOUS | Status: DC | PRN
  Administered 2012-03-24 (×2): 5 mL via EPIDURAL

## 2012-03-24 MED ORDER — LIDOCAINE HCL (PF) 1 % IJ SOLN
INTRAMUSCULAR | Status: DC | PRN
  Administered 2012-03-24 (×4): 4 mL

## 2012-03-24 MED ORDER — CITRIC ACID-SODIUM CITRATE 334-500 MG/5ML PO SOLN: 30.0000 mL | ORAL | Status: DC | PRN

## 2012-03-24 MED ORDER — SODIUM CHLORIDE 0.9 % IJ SOLN: 3.0000 mL | Freq: Two times a day (BID) | INTRAMUSCULAR | Status: DC

## 2012-03-24 MED ORDER — LIDOCAINE-EPINEPHRINE (PF) 2 %-1:200000 IJ SOLN
INTRAMUSCULAR | Status: AC
  Filled 2012-03-24: qty 20

## 2012-03-24 MED ORDER — DEXAMETHASONE SODIUM PHOSPHATE 10 MG/ML IJ SOLN
INTRAMUSCULAR | Status: AC
  Filled 2012-03-24: qty 1

## 2012-03-24 MED ORDER — PROPOFOL 10 MG/ML IV EMUL
INTRAVENOUS | Status: AC
  Filled 2012-03-24: qty 20

## 2012-03-24 MED ORDER — PHENYLEPHRINE 40 MCG/ML (10ML) SYRINGE FOR IV PUSH (FOR BLOOD PRESSURE SUPPORT)
80.0000 ug | PREFILLED_SYRINGE | INTRAVENOUS | Status: DC | PRN
  Filled 2012-03-24: qty 5

## 2012-03-24 MED ORDER — ALBUTEROL SULFATE HFA 108 (90 BASE) MCG/ACT IN AERS
INHALATION_SPRAY | RESPIRATORY_TRACT | Status: AC
  Filled 2012-03-24: qty 6.7

## 2012-03-24 MED ORDER — LACTATED RINGERS IV SOLN
INTRAVENOUS | Status: DC | PRN
  Administered 2012-03-24 (×2): via INTRAVENOUS

## 2012-03-24 MED ORDER — IBUPROFEN 600 MG PO TABS
600.0000 mg | ORAL_TABLET | Freq: Four times a day (QID) | ORAL | Status: DC | PRN
  Filled 2012-03-24: qty 1

## 2012-03-24 MED ORDER — HYDROXYZINE HCL 50 MG PO TABS: 50.0000 mg | ORAL_TABLET | Freq: Four times a day (QID) | ORAL | Status: DC | PRN

## 2012-03-24 MED ORDER — IBUPROFEN 600 MG PO TABS
600.0000 mg | ORAL_TABLET | Freq: Four times a day (QID) | ORAL | Status: DC
  Administered 2012-03-24 – 2012-03-25 (×6): 600 mg via ORAL
  Filled 2012-03-24 (×5): qty 1

## 2012-03-24 MED ORDER — ACETAMINOPHEN 325 MG PO TABS: 650.0000 mg | ORAL_TABLET | ORAL | Status: DC | PRN

## 2012-03-24 MED ORDER — DIBUCAINE 1 % RE OINT: 1.0000 "application " | TOPICAL_OINTMENT | RECTAL | Status: DC | PRN

## 2012-03-24 MED ORDER — ZOLPIDEM TARTRATE 5 MG PO TABS: 5.0000 mg | ORAL_TABLET | Freq: Every evening | ORAL | Status: DC | PRN

## 2012-03-24 MED ORDER — ONDANSETRON HCL 4 MG/2ML IJ SOLN
INTRAMUSCULAR | Status: AC
  Filled 2012-03-24: qty 2

## 2012-03-24 MED ORDER — SODIUM CHLORIDE 0.9 % IR SOLN
Status: DC | PRN
  Administered 2012-03-24: 1000 mL

## 2012-03-24 MED ORDER — LACTATED RINGERS IV SOLN
INTRAVENOUS | Status: DC
  Administered 2012-03-24 (×2): via INTRAVENOUS

## 2012-03-24 MED ORDER — DIPHENHYDRAMINE HCL 50 MG/ML IJ SOLN: 12.5000 mg | INTRAMUSCULAR | Status: DC | PRN

## 2012-03-24 MED ORDER — LACTATED RINGERS IV SOLN: 500.0000 mL | INTRAVENOUS | Status: DC | PRN

## 2012-03-24 MED ORDER — SODIUM CHLORIDE 0.9 % IV SOLN: 250.0000 mL | INTRAVENOUS | Status: DC | PRN

## 2012-03-24 SURGICAL SUPPLY — 25 items
BENZOIN TINCTURE PRP APPL 2/3 (GAUZE/BANDAGES/DRESSINGS) ×2 IMPLANT
CLIP FILSHIE TUBAL LIGA STRL (Clip) ×4 IMPLANT
CLOTH BEACON ORANGE TIMEOUT ST (SAFETY) ×2 IMPLANT
CONTAINER PREFILL 10% NBF 15ML (MISCELLANEOUS) IMPLANT
DRESSING TELFA 8X3 (GAUZE/BANDAGES/DRESSINGS) ×2 IMPLANT
DRSG COVADERM PLUS 2X2 (GAUZE/BANDAGES/DRESSINGS) ×2 IMPLANT
GLOVE BIOGEL PI IND STRL 9 (GLOVE) ×2 IMPLANT
GLOVE BIOGEL PI INDICATOR 9 (GLOVE) ×2
GLOVE ECLIPSE 9.0 STRL (GLOVE) ×2 IMPLANT
GLOVE SKINSENSE NS SZ6.5 (GLOVE) ×3
GLOVE SKINSENSE STRL SZ6.5 (GLOVE) ×3 IMPLANT
GOWN PREVENTION PLUS LG XLONG (DISPOSABLE) ×4 IMPLANT
GOWN PREVENTION PLUS XXLARGE (GOWN DISPOSABLE) ×2 IMPLANT
GOWN STRL REIN 3XL LVL4 (GOWN DISPOSABLE) ×2 IMPLANT
NS IRRIG 1000ML POUR BTL (IV SOLUTION) ×2 IMPLANT
PACK ABDOMINAL MINOR (CUSTOM PROCEDURE TRAY) ×2 IMPLANT
SPONGE LAP 4X18 X RAY DECT (DISPOSABLE) ×2 IMPLANT
STRIP CLOSURE SKIN 1/2X4 (GAUZE/BANDAGES/DRESSINGS) ×2 IMPLANT
SUT VIC AB 0 CT1 27 (SUTURE) ×1
SUT VIC AB 0 CT1 27XBRD ANBCTR (SUTURE) ×1 IMPLANT
SUT VIC AB 4-0 PS2 27 (SUTURE) ×2 IMPLANT
TAPE CLOTH SURG 4X10 WHT LF (GAUZE/BANDAGES/DRESSINGS) ×2 IMPLANT
TOWEL OR 17X24 6PK STRL BLUE (TOWEL DISPOSABLE) ×4 IMPLANT
TRAY FOLEY CATH 14FR (SET/KITS/TRAYS/PACK) ×2 IMPLANT
WATER STERILE IRR 1000ML POUR (IV SOLUTION) IMPLANT

## 2012-03-24 NOTE — Anesthesia Postprocedure Evaluation (Signed)
  Anesthesia Post-op Note  Patient: [name] [name]  Procedure(s) Performed: Procedure(s) (LRB) with comments: POST PARTUM TUBAL LIGATION (Bilateral) - Bilateral post partum tubal ligation with filshie clips  Patient Location: PACU  Anesthesia Type:Epidural  Level of Consciousness: awake, alert  and oriented  Airway and Oxygen Therapy: Patient Spontanous Breathing  Post-op Pain: none  Post-op Assessment: Post-op Vital signs reviewed, Patient's Cardiovascular Status Stable, Respiratory Function Stable, Patent Airway, No signs of Nausea or vomiting, Pain level controlled, No headache, No backache, No residual numbness and No residual motor weakness  Post-op Vital Signs: Reviewed and stable  Complications: No apparent anesthesia complications

## 2012-03-24 NOTE — Anesthesia Postprocedure Evaluation (Signed)
  Anesthesia Post-op Note  Patient: [name] [name]  Procedure(s) Performed: * No procedures listed *  Patient Location: Mother/Baby  Anesthesia Type:Epidural  Level of Consciousness: awake, alert  and oriented  Airway and Oxygen Therapy: Patient Spontanous Breathing  Post-op Pain: mild  Post-op Assessment: Patient's Cardiovascular Status Stable, Respiratory Function Stable, Patent Airway, No signs of Nausea or vomiting and Pain level controlled  Post-op Vital Signs: stable  Complications: No apparent anesthesia complications
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
## 2012-03-24 NOTE — Anesthesia Preprocedure Evaluation (Signed)
Anesthesia Evaluation  Patient identified by MRN, date of birth, ID band Patient awake    Reviewed: Allergy & Precautions, H&P , NPO status , Patient's Chart, lab work & pertinent test results, reviewed documented beta blocker date and time   History of Anesthesia Complications (+) POST - OP SPINAL HEADACHE  Airway Mallampati: II TM Distance: >3 FB Neck ROM: full    Dental No notable dental hx. (+) Teeth Intact   Pulmonary  breath sounds clear to auscultation  Pulmonary exam normal       Cardiovascular negative cardio ROS  Rhythm:regular Rate:Normal     Neuro/Psych PSYCHIATRIC DISORDERS (depression) negative neurological ROS     GI/Hepatic negative GI ROS, Neg liver ROS,   Endo/Other  negative endocrine ROS  Renal/GU negative Renal ROS  negative genitourinary   Musculoskeletal   Abdominal Normal abdominal exam  (+)   Peds  Hematology negative hematology ROS (+)   Anesthesia Other Findings   Reproductive/Obstetrics negative OB ROS Desires permanent contraception                           Anesthesia Physical  Anesthesia Plan  ASA: II  Anesthesia Plan: Epidural   Post-op Pain Management:    Induction:   Airway Management Planned:   Additional Equipment:   Intra-op Plan:   Post-operative Plan:   Informed Consent: I have reviewed the patients History and Physical, chart, labs and discussed the procedure including the risks, benefits and alternatives for the proposed anesthesia with the patient or authorized representative who has indicated his/her understanding and acceptance.     Plan Discussed with: Anesthesiologist, CRNA and Surgeon  Anesthesia Plan Comments:         Anesthesia Quick Evaluation

## 2012-03-24 NOTE — Progress Notes (Addendum)
[name] [name] is a 31 y.o. [phone] at [redacted]w[redacted]d admitted for active labor, rupture of membranes  Subjective: Still slightly uncomfortable s/p epidural, denies pressure  Objective: BP 115/64  Pulse 83  Temp 97.8 F (36.6 C) (Oral)  Resp 20  Ht 5\' 2"  (1.575 m)  Wt 75.751 kg (167 lb)  BMI 30.54 kg/m2  LMP 04/27/2011      FHT:  FHR: 150 bpm, variability: minimal ,  accelerations:  Present,  decelerations:  Present earlies, variable UC:   regular, every 2-4 minutes SVE:   Dilation: 4.5 Effacement (%): 90 Station: -1 Exam by:: kbooker, cnm Pos scalp stim  RN hit epidural pcea button for pain relief  Labs: Lab Results  Component Value Date   WBC 11.5* 03/24/2012   HGB 12.9 03/24/2012   HCT 37.6 03/24/2012   MCV 90.8 03/24/2012   PLT 127* 03/24/2012    Assessment / Plan: Spontaneous labor, progressing normally  Labor: Progressing normally Preeclampsia:  n/a Fetal Wellbeing:  Category II Pain Control:  Epidural I/D:  n/a Anticipated MOD:  NSVD  Marge Duncans 03/24/2012, 2:41 AM

## 2012-03-24 NOTE — Anesthesia Procedure Notes (Signed)
Epidural Patient location during procedure: OB Start time: 03/24/2012 1:31 AM  Staffing Performed by: anesthesiologist   Preanesthetic Checklist Completed: patient identified, site marked, surgical consent, pre-op evaluation, timeout performed, IV checked, risks and benefits discussed and monitors and equipment checked  Epidural Patient position: sitting Prep: site prepped and draped and DuraPrep Patient monitoring: continuous pulse ox and blood pressure Approach: midline Injection technique: LOR air  Needle:  Needle type: Tuohy  Needle gauge: 17 G Needle length: 9 cm and 9 Needle insertion depth: 6 cm Catheter type: closed end flexible Catheter size: 19 Gauge Catheter at skin depth: 11 cm Test dose: negative  Assessment Events: blood not aspirated, injection not painful, no injection resistance, negative IV test and no paresthesia  Additional Notes Discussed risk of headache, infection, bleeding, nerve injury and failed or incomplete block.  Patient voices understanding and wishes to proceed. Reason for block:procedure for pain

## 2012-03-24 NOTE — Progress Notes (Signed)
Post Partum Day 0 Subjective: pt is scheduled for tubal ligation this morning, She confirms the dersire for permanent sterilization, has signed sterilization forms, and procedure for Filshie clips reveiwed with patient to her satisfaction. Pt denies further questions.  Objective: Blood pressure 116/65, pulse 69, temperature 98.5 F (36.9 C), temperature source Oral, resp. rate 18, height 5\' 2"  (1.575 m), weight 167 lb (75.751 kg), last menstrual period 04/27/2011, SpO2 96.00%, unknown if currently breastfeeding.  Physical Exam:  General: alert, cooperative, no distress and mildly obese Lochia: appropriate Uterine Fundus: firm at U+2 Incision:  DVT Evaluation: No evidence of DVT seen on physical exam.   Basename 03/24/12 0020  HGB 12.9  HCT 37.6    Assessment/Plan: Contraception Tubal ligation: Filshie Clips this morning   LOS: 1 day   Ruble Buttler V 03/24/2012, 8:44 AM

## 2012-03-24 NOTE — Anesthesia Preprocedure Evaluation (Signed)
Anesthesia Evaluation  Patient identified by MRN, date of birth, ID band Patient awake    Reviewed: Allergy & Precautions, H&P , NPO status , Patient's Chart, lab work & pertinent test results, reviewed documented beta blocker date and time   History of Anesthesia Complications (+) POST - OP SPINAL HEADACHE  Airway Mallampati: II TM Distance: >3 FB Neck ROM: full    Dental  (+) Teeth Intact   Pulmonary  breath sounds clear to auscultation        Cardiovascular negative cardio ROS  Rhythm:regular Rate:Normal     Neuro/Psych PSYCHIATRIC DISORDERS (depression) negative neurological ROS     GI/Hepatic negative GI ROS, Neg liver ROS,   Endo/Other  negative endocrine ROS  Renal/GU negative Renal ROS     Musculoskeletal   Abdominal   Peds  Hematology negative hematology ROS (+)   Anesthesia Other Findings   Reproductive/Obstetrics (+) Pregnancy                           Anesthesia Physical Anesthesia Plan  ASA: II  Anesthesia Plan: Epidural   Post-op Pain Management:    Induction:   Airway Management Planned:   Additional Equipment:   Intra-op Plan:   Post-operative Plan:   Informed Consent: I have reviewed the patients History and Physical, chart, labs and discussed the procedure including the risks, benefits and alternatives for the proposed anesthesia with the patient or authorized representative who has indicated his/her understanding and acceptance.     Plan Discussed with:   Anesthesia Plan Comments:         Anesthesia Quick Evaluation

## 2012-03-24 NOTE — Transfer of Care (Signed)
Immediate Anesthesia Transfer of Care Note  Patient: [name] [name]  Procedure(s) Performed: Procedure(s) (LRB) with comments: POST PARTUM TUBAL LIGATION (Bilateral) - Bilateral post partum tubal ligation with filshie clips  Patient Location: PACU  Anesthesia Type:Epidural  Level of Consciousness: awake, alert  and oriented  Airway & Oxygen Therapy: Patient Spontanous Breathing  Post-op Assessment: Report given to PACU RN and Post -op Vital signs reviewed and stable  Post vital signs: Reviewed and stable  Complications: No apparent anesthesia complications

## 2012-03-24 NOTE — Op Note (Signed)
[name] [name]  03/24/2012  PREOPERATIVE DIAGNOSIS:  Multiparity, undesired fertility  POSTOPERATIVE DIAGNOSIS:  Multiparity, undesired fertility  PROCEDURE:  Postpartum Bilateral Tubal Sterilization using Filshie Clips   SURGEON: Christin Bach, MD  ASSISTANT:  Napoleon Form, MD  ANESTHESIA:  Epidural and local analgesia using 0.5% Marcaine  COMPLICATIONS:  None immediate.  ESTIMATED BLOOD LOSS: 25 ml.  FLUIDS: 1000 ml LR.  URINE OUTPUT:  100 ml of clear urine.  INDICATIONS:   31 y.o. [id number] with undesired fertility, status post vaginal delivery, desires permanent sterilization.  Other reversible forms of contraception were discussed with patient; she declines all other modalities. Risks of procedure discussed with patient including but not limited to: risk of regret, permanence of method, bleeding, infection, injury to surrounding organs and need for additional procedures.  Failure risk of 0.5-1% with increased risk of ectopic gestation if pregnancy occurs was also discussed with patient.     FINDINGS:  Normal uterus, tubes, and ovaries.  PROCEDURE DETAILS: The patient was taken to the operating room where her epidural anesthesia was dosed up to surgical level and found to be adequate.  She was then placed in the dorsal supine position and prepped and draped in sterile fashion.  After an adequate timeout was performed, attention was turned to the patient's abdomen where a small transverse skin incision was made under the umbilical fold. The incision was taken down to the layer of fascia using the scalpel, and fascia was incised, and extended bilaterally using Mayo scissors. The peritoneum was entered in a sharp fashion. Attention was then turned to the patient's uterus, and left fallopian tube was identified and followed out to the fimbriated end.  A Filshie clip was placed on the left fallopian tube about 3 cm from the cornual attachment, with care given to incorporate the underlying  mesosalpinx.  A similar process was carried out on the right side allowing for bilateral tubal sterilization.  Good hemostasis was noted overall.  The instruments were then removed from the patient's abdomen and the fascial incision was repaired with 0 Vicryl, and the skin was closed with a 4-0 Vicryl subcuticular stitch. The patient tolerated the procedure well.  Instrument, sponge, and needle counts were correct times two.  The patient was then taken to the recovery room awake and in stable condition.  Napoleon Form, MD 03/24/2012 10:04 AM

## 2012-03-24 NOTE — OR Nursing (Signed)
Filshie clips applied to right and left fallopian tubes by Dr. Sherryl Manges on 03/24/2012. Lot [id number]. Expiration date- 2016/07. Manufacture- [employer].

## 2012-03-24 NOTE — Progress Notes (Signed)
Pt ambulates to br, unable to void, pericare done and taught.  Pt ambulates back to bed, straight cath'd, gown changed, ambulates to wc

## 2012-03-25 ENCOUNTER — Encounter (HOSPITAL_COMMUNITY): Payer: Self-pay | Admitting: Obstetrics and Gynecology

## 2012-03-25 MED ORDER — IBUPROFEN 600 MG PO TABS: 600.0000 mg | ORAL_TABLET | Freq: Four times a day (QID) | ORAL | Status: DC

## 2012-03-25 MED ORDER — HYDROCODONE-ACETAMINOPHEN 5-325 MG PO TABS
1.0000 | ORAL_TABLET | Freq: Four times a day (QID) | ORAL | Status: DC | PRN
Start: 2012-03-25 — End: 2013-01-24

## 2012-03-25 NOTE — Addendum Note (Signed)
Addendum  created 03/25/12 1225 by Earmon Phoenix, CRNA   Modules edited:Notes Section

## 2012-03-25 NOTE — Progress Notes (Deleted)
Post Partum Day 1 Subjective: no complaints, up ad lib, voiding, tolerating PO and + flatus  Objective: Blood pressure 105/68, pulse 68, temperature 98.6 F (37 C), temperature source Oral, resp. rate 18, height 5\' 2"  (1.575 m), weight 75.751 kg (167 lb), last menstrual period 04/27/2011, SpO2 100.00%, unknown if currently breastfeeding.  Physical Exam:  General: alert, cooperative and no distress Lochia: appropriate Uterine Fundus: firm DVT Evaluation: No evidence of DVT seen on physical exam.   Basename 03/24/12 0020  HGB 12.9  HCT 37.6    Assessment/Plan: Breastfeeding and Contraception tubal ligation    LOS: 2 days   [name], [name] 03/25/2012, 10:37 AM

## 2012-03-25 NOTE — Anesthesia Postprocedure Evaluation (Signed)
  Anesthesia Post-op Note  Patient: [name] [name]  Procedure(s) Performed: Procedure(s) (LRB) with comments: POST PARTUM TUBAL LIGATION (Bilateral) - Bilateral post partum tubal ligation with filshie clips  Patient Location: Mother/Baby  Anesthesia Type:Epidural  Level of Consciousness: awake, alert  and oriented  Airway and Oxygen Therapy: Patient Spontanous Breathing  Post-op Pain: mild  Post-op Assessment: Patient's Cardiovascular Status Stable, Respiratory Function Stable, No headache, No backache, No residual numbness and No residual motor weakness  Post-op Vital Signs: stable  Complications: No apparent anesthesia complications

## 2012-03-25 NOTE — Progress Notes (Signed)
Patient was referred for history of depression/anxiety. * Referral screened out by Clinical Social Worker because none of the following criteria appear to apply:  ~ History of anxiety/depression during this pregnancy, or of post-partum depression.  ~ Diagnosis of anxiety and/or depression within last 3 years, as per pt.  ~ History of depression due to pregnancy loss/loss of child  OR * Patient's symptoms currently being treated with medication and/or therapy.  Please contact the Clinical Social Worker if needs arise, or by the patient's request.  

## 2012-03-25 NOTE — Discharge Summary (Signed)
Seen by me also and agree with note Anna-Kendyl Festa Coller CNM 

## 2012-03-25 NOTE — Discharge Instructions (Signed)
Vaginal Delivery Care After  Change your pad on each trip to the bathroom.  Wipe gently with toilet paper during your hospital stay. Always wipe from front to back. A spray bottle with warm tap water could also be used or a towelette if available.  Place your soiled pad and toilet paper in a bathroom wastebasket with a plastic bag liner.  During your hospital stay, save any clots. If you pass a clot while on the toilet, do not flush it. Also, if your vaginal flow seems excessive to you, notify nursing personnel.  The first time you get out of bed after delivery, wait for assistance from a nurse. Do not get up alone at any time if you feel weak or dizzy.  Bend and extend your ankles forcefully so that you feel the calves of your legs get hard. Do this 6 times every hour when you are in bed and awake.  Do not sit with one foot under you, dangle your legs over the edge of the bed, or maintain a position that hinders the circulation in your legs.  Many women experience after pains for 2 to 3 days after delivery. These after pains are mild uterine contractions. Ask the nurse for a pain medication if you need something for this. Sometimes breastfeeding stimulates after pains; if you find this to be true, ask for the medication  -  hour before the next feeding.  For you and your infant's protection, do not go beyond the door(s) of the obstetric unit. Do not carry your baby in your arms in the hallway. When taking your baby to and from your room, put your baby in the bassinet and push the bassinet.  Mothers may have their babies in their room as much as they desire. Document Released: 04/21/2000 Document Revised: 07/17/2011 Document Reviewed: 03/22/2007 [employer] Patient Information 2013 [employer].  

## 2012-03-25 NOTE — Progress Notes (Signed)
Ur chart review completed.  

## 2012-03-25 NOTE — Discharge Summary (Addendum)
Obstetric Discharge Summary Reason for Admission: rupture of membranes Prenatal Procedures: ultrasound Intrapartum Procedures: spontaneous vaginal delivery Postpartum Procedures: P.P. tubal ligation Complications-Operative and Postpartum: none Hemoglobin  Date Value Range Status  03/24/2012 12.9  12.0 - 15.0 g/dL Final     HCT  Date Value Range Status  03/24/2012 37.6  36.0 - 46.0 % Final    Physical Exam:  General: alert, cooperative and no distress Lochia: appropriate Uterine Fundus: firm DVT Evaluation: No evidence of DVT seen on physical exam.  Discharge Diagnoses: Term Pregnancy-delivered  Discharge Information: Date: 03/25/2012 Activity: unrestricted Diet: routine Medications: Ibuprofen and Vicodin Condition: stable Instructions: refer to practice specific booklet Discharge to: home   Newborn Data: Live born female  Birth Weight: 7 lb 0.5 oz (3189 g) APGAR: 8,   Home with mother.  Follow-up Information    Follow up with [facility]. On 04/29/2012. (at 12:45 pm)    Contact information:   [address] [place] [address] [phone]         Lillia Abed 03/25/2012, 10:39 AM

## 2012-03-26 LAB — MRSA CULTURE

## 2012-03-27 NOTE — Discharge Summary (Signed)
Attestation of Attending Supervision of Advanced Practitioner (CNM/NP): Evaluation and management procedures were performed by the Advanced Practitioner under my supervision and collaboration.  I have reviewed the Advanced Practitioner's note and chart, and I agree with the management and plan.  HARRAWAY-SMITH, Jayro Mcmath 12:11 PM     

## 2012-03-28 NOTE — Op Note (Signed)
Postpartum tubal ligation as described . I was present throughout entire procedure and scrubbed

## 2012-04-10 NOTE — H&P (Signed)
Agree with above note.  [name] 04/10/2012 9:12 PM

## 2012-04-29 ENCOUNTER — Ambulatory Visit: Payer: Medicaid Other | Admitting: Obstetrics & Gynecology

## 2012-08-12 ENCOUNTER — Encounter (HOSPITAL_COMMUNITY): Payer: Self-pay | Admitting: Emergency Medicine

## 2012-08-12 ENCOUNTER — Emergency Department (HOSPITAL_COMMUNITY)
Admission: EM | Admit: 2012-08-12 | Discharge: 2012-08-12 | Disposition: A | Discharge status: Home / Self Care | Payer: Self-pay | Attending: Emergency Medicine | Admitting: Emergency Medicine

## 2012-08-12 DIAGNOSIS — Z8679 Personal history of other diseases of the circulatory system: Secondary | ICD-10-CM | POA: Insufficient documentation

## 2012-08-12 DIAGNOSIS — T2101XA Burn of unspecified degree of chest wall, initial encounter: Secondary | ICD-10-CM | POA: Insufficient documentation

## 2012-08-12 DIAGNOSIS — Z8611 Personal history of tuberculosis: Secondary | ICD-10-CM | POA: Insufficient documentation

## 2012-08-12 DIAGNOSIS — Z8659 Personal history of other mental and behavioral disorders: Secondary | ICD-10-CM | POA: Insufficient documentation

## 2012-08-12 DIAGNOSIS — T2200XA Burn of unspecified degree of shoulder and upper limb, except wrist and hand, unspecified site, initial encounter: Secondary | ICD-10-CM | POA: Insufficient documentation

## 2012-08-12 DIAGNOSIS — Y929 Unspecified place or not applicable: Secondary | ICD-10-CM | POA: Insufficient documentation

## 2012-08-12 DIAGNOSIS — IMO0002 Reserved for concepts with insufficient information to code with codable children: Secondary | ICD-10-CM | POA: Insufficient documentation

## 2012-08-12 DIAGNOSIS — T2000XA Burn of unspecified degree of head, face, and neck, unspecified site, initial encounter: Secondary | ICD-10-CM | POA: Insufficient documentation

## 2012-08-12 DIAGNOSIS — X118XXA Contact with other hot tap-water, initial encounter: Secondary | ICD-10-CM | POA: Insufficient documentation

## 2012-08-12 DIAGNOSIS — Y9389 Activity, other specified: Secondary | ICD-10-CM | POA: Insufficient documentation

## 2012-08-12 DIAGNOSIS — Z87891 Personal history of nicotine dependence: Secondary | ICD-10-CM | POA: Insufficient documentation

## 2012-08-12 DIAGNOSIS — T310 Burns involving less than 10% of body surface: Secondary | ICD-10-CM | POA: Insufficient documentation

## 2012-08-12 DIAGNOSIS — Z79899 Other long term (current) drug therapy: Secondary | ICD-10-CM | POA: Insufficient documentation

## 2012-08-12 MED ORDER — BACITRACIN ZINC 500 UNIT/GM EX OINT
TOPICAL_OINTMENT | Freq: Two times a day (BID) | CUTANEOUS | Status: DC
Start: 2012-08-12 — End: 2013-01-24

## 2012-08-12 MED ORDER — OXYCODONE-ACETAMINOPHEN 5-325 MG PO TABS
2.0000 | ORAL_TABLET | Freq: Once | ORAL | Status: AC
  Administered 2012-08-12: 2 via ORAL
  Filled 2012-08-12: qty 2

## 2012-08-12 MED ORDER — SILVER SULFADIAZINE 1 % EX CREA
TOPICAL_CREAM | Freq: Once | CUTANEOUS | Status: AC
  Administered 2012-08-12: 11:00:00 via TOPICAL
  Filled 2012-08-12: qty 50

## 2012-08-12 MED ORDER — SILVER SULFADIAZINE 1 % EX CREA
TOPICAL_CREAM | Freq: Every day | CUTANEOUS | Status: DC
Start: 2012-08-12 — End: 2013-01-24

## 2012-08-12 MED ORDER — HYDROCODONE-ACETAMINOPHEN 5-325 MG PO TABS: 1.0000 | ORAL_TABLET | ORAL | Status: DC | PRN

## 2012-08-12 MED ORDER — ONDANSETRON 8 MG PO TBDP
8.0000 mg | ORAL_TABLET | Freq: Once | ORAL | Status: AC
  Administered 2012-08-12: 8 mg via ORAL
  Filled 2012-08-12: qty 1
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
## 2012-08-14 NOTE — ED Provider Notes (Signed)
Medical screening examination/treatment/procedure(s) were performed by non-physician practitioner and as supervising physician I was immediately available for consultation/collaboration.   Suzi Roots, MD 08/14/12 2147

## 2013-01-24 ENCOUNTER — Emergency Department (HOSPITAL_COMMUNITY)
Admission: EM | Admit: 2013-01-24 | Discharge: 2013-01-24 | Disposition: A | Discharge status: Home / Self Care | Payer: Medicaid Other | Attending: Emergency Medicine | Admitting: Emergency Medicine

## 2013-01-24 ENCOUNTER — Encounter (HOSPITAL_COMMUNITY): Payer: Self-pay | Admitting: Emergency Medicine

## 2013-01-24 DIAGNOSIS — Z8679 Personal history of other diseases of the circulatory system: Secondary | ICD-10-CM | POA: Insufficient documentation

## 2013-01-24 DIAGNOSIS — S0990XA Unspecified injury of head, initial encounter: Secondary | ICD-10-CM | POA: Insufficient documentation

## 2013-01-24 DIAGNOSIS — Z8619 Personal history of other infectious and parasitic diseases: Secondary | ICD-10-CM | POA: Insufficient documentation

## 2013-01-24 DIAGNOSIS — S3981XA Other specified injuries of abdomen, initial encounter: Secondary | ICD-10-CM | POA: Insufficient documentation

## 2013-01-24 DIAGNOSIS — Z8659 Personal history of other mental and behavioral disorders: Secondary | ICD-10-CM | POA: Insufficient documentation

## 2013-01-24 DIAGNOSIS — R109 Unspecified abdominal pain: Secondary | ICD-10-CM

## 2013-01-24 DIAGNOSIS — R197 Diarrhea, unspecified: Secondary | ICD-10-CM | POA: Insufficient documentation

## 2013-01-24 DIAGNOSIS — Z3202 Encounter for pregnancy test, result negative: Secondary | ICD-10-CM | POA: Insufficient documentation

## 2013-01-24 DIAGNOSIS — Z87891 Personal history of nicotine dependence: Secondary | ICD-10-CM | POA: Insufficient documentation

## 2013-01-24 LAB — COMPREHENSIVE METABOLIC PANEL
ALT: 18 U/L (ref 0–35)
AST: 19 U/L (ref 0–37)
CO2: 24 mEq/L (ref 19–32)
Calcium: 8.4 mg/dL (ref 8.4–10.5)
GFR calc non Af Amer: >90 mL/min (ref >90)
Sodium: 138 mEq/L (ref 135–145)
Total Protein: 6.3 g/dL (ref 6.0–8.3)

## 2013-01-24 LAB — CBC
MCH: 29.9 pg (ref 26.0–34.0)
Platelets: 188 10*3/uL (ref 150–400)
RBC: 4.91 MIL/uL (ref 3.87–5.11)
WBC: 7.8 10*3/uL (ref 4.0–10.5)

## 2013-01-24 LAB — URINALYSIS, ROUTINE W REFLEX MICROSCOPIC
Bilirubin Urine: NEGATIVE
Glucose, UA: NEGATIVE mg/dL
Hgb urine dipstick: NEGATIVE
Specific Gravity, Urine: 1.012 (ref 1.005–1.030)
pH: 6 (ref 5.0–8.0)

## 2013-01-24 LAB — PREGNANCY, URINE: Preg Test, Ur: NEGATIVE

## 2013-01-24 MED ORDER — GI COCKTAIL ~~LOC~~
30.0000 mL | Freq: Once | ORAL | Status: AC
  Administered 2013-01-24: 30 mL via ORAL
  Filled 2013-01-24: qty 30

## 2013-01-24 MED ORDER — DICYCLOMINE HCL 20 MG PO TABS: 20.0000 mg | ORAL_TABLET | Freq: Two times a day (BID) | ORAL | Status: DC

## 2013-01-24 MED ORDER — ESOMEPRAZOLE MAGNESIUM 40 MG PO CPDR: 40.0000 mg | DELAYED_RELEASE_CAPSULE | Freq: Every day | ORAL | Status: DC
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
## 2013-01-29 NOTE — ED Provider Notes (Signed)
Medical screening examination/treatment/procedure(s) were performed by non-physician practitioner and as supervising physician I was immediately available for consultation/collaboration.   Gwyneth Sprout, MD 01/29/13 1807

## 2013-02-02 ENCOUNTER — Emergency Department (HOSPITAL_COMMUNITY): Payer: Medicaid Other

## 2013-02-02 ENCOUNTER — Emergency Department (HOSPITAL_COMMUNITY)
Admission: EM | Admit: 2013-02-02 | Discharge: 2013-02-02 | Disposition: A | Discharge status: Home / Self Care | Payer: Medicaid Other | Attending: Emergency Medicine | Admitting: Emergency Medicine

## 2013-02-02 ENCOUNTER — Encounter (HOSPITAL_COMMUNITY): Payer: Self-pay | Admitting: Nurse Practitioner

## 2013-02-02 DIAGNOSIS — Y9389 Activity, other specified: Secondary | ICD-10-CM | POA: Insufficient documentation

## 2013-02-02 DIAGNOSIS — Z87891 Personal history of nicotine dependence: Secondary | ICD-10-CM | POA: Insufficient documentation

## 2013-02-02 DIAGNOSIS — J3489 Other specified disorders of nose and nasal sinuses: Secondary | ICD-10-CM | POA: Insufficient documentation

## 2013-02-02 DIAGNOSIS — Y990 Civilian activity done for income or pay: Secondary | ICD-10-CM | POA: Insufficient documentation

## 2013-02-02 DIAGNOSIS — Z8669 Personal history of other diseases of the nervous system and sense organs: Secondary | ICD-10-CM | POA: Insufficient documentation

## 2013-02-02 DIAGNOSIS — J069 Acute upper respiratory infection, unspecified: Secondary | ICD-10-CM | POA: Insufficient documentation

## 2013-02-02 DIAGNOSIS — Z8679 Personal history of other diseases of the circulatory system: Secondary | ICD-10-CM | POA: Insufficient documentation

## 2013-02-02 DIAGNOSIS — Y9289 Other specified places as the place of occurrence of the external cause: Secondary | ICD-10-CM | POA: Insufficient documentation

## 2013-02-02 DIAGNOSIS — S63501A Unspecified sprain of right wrist, initial encounter: Secondary | ICD-10-CM

## 2013-02-02 DIAGNOSIS — X500XXA Overexertion from strenuous movement or load, initial encounter: Secondary | ICD-10-CM | POA: Insufficient documentation

## 2013-02-02 DIAGNOSIS — X503XXA Overexertion from repetitive movements, initial encounter: Secondary | ICD-10-CM | POA: Insufficient documentation

## 2013-02-02 DIAGNOSIS — Z8611 Personal history of tuberculosis: Secondary | ICD-10-CM | POA: Insufficient documentation

## 2013-02-02 DIAGNOSIS — S63509A Unspecified sprain of unspecified wrist, initial encounter: Secondary | ICD-10-CM | POA: Insufficient documentation

## 2013-02-02 DIAGNOSIS — Z8659 Personal history of other mental and behavioral disorders: Secondary | ICD-10-CM | POA: Insufficient documentation

## 2013-02-02 IMAGING — CR DG WRIST COMPLETE 3+V*R*
4 series · 4 of 4 positions shown · non-contrast
Comparison: None

CLINICAL DATA: Right wrist pain at ulnar styloid, no known injury,
initial in counter

EXAM:
RIGHT WRIST - COMPLETE 3+ VIEW

[x wrist pa right]
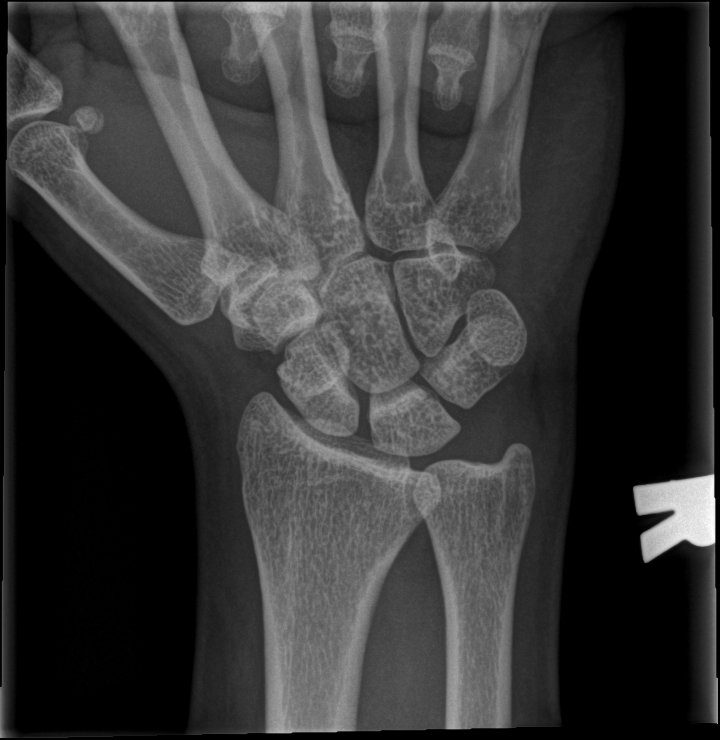

[x wrist obl right]
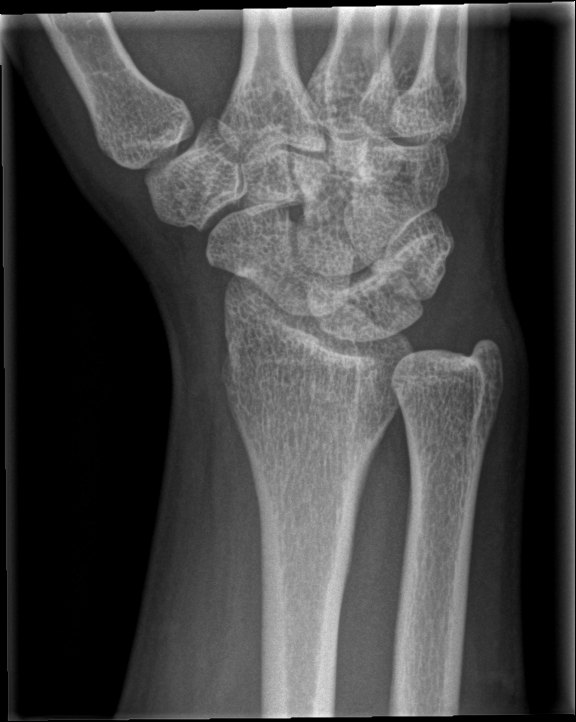

[x wrist lat right]
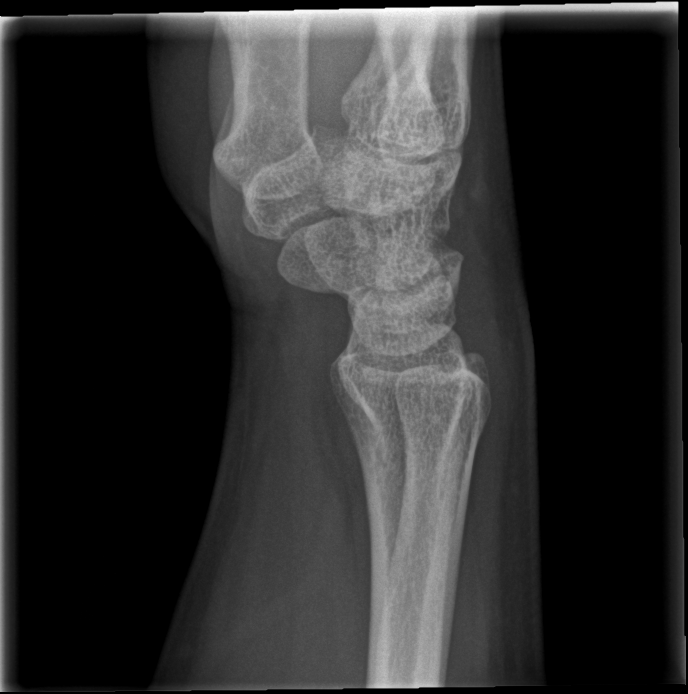

[x wrist navicular view right]
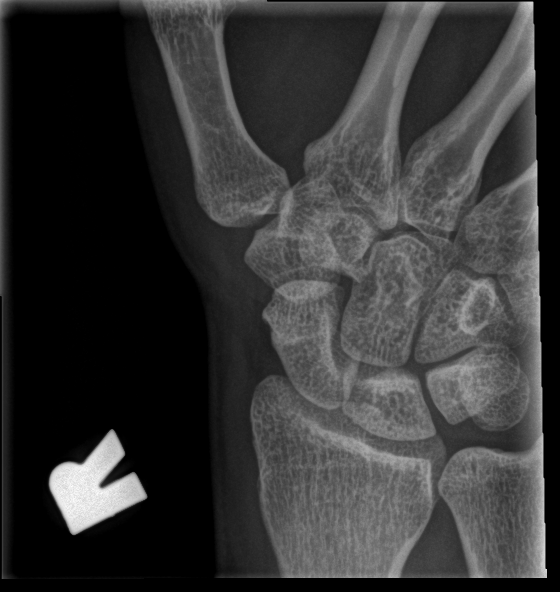

[4 of 4 positions shown; findings below may reference images not displayed]

FINDINGS: Osseous mineralization normal.

Joint spaces preserved.

No fracture, dislocation, or bone destruction.
IMPRESSION: Normal exam.

## 2013-02-02 IMAGING — CR DG CHEST 2V
2 series · 2 of 2 positions shown · non-contrast
Comparison: [DATE]

CLINICAL DATA: Sore throat and cough for 3 days, initial encounter

EXAM:
CHEST  2 VIEW

[w chest pa]
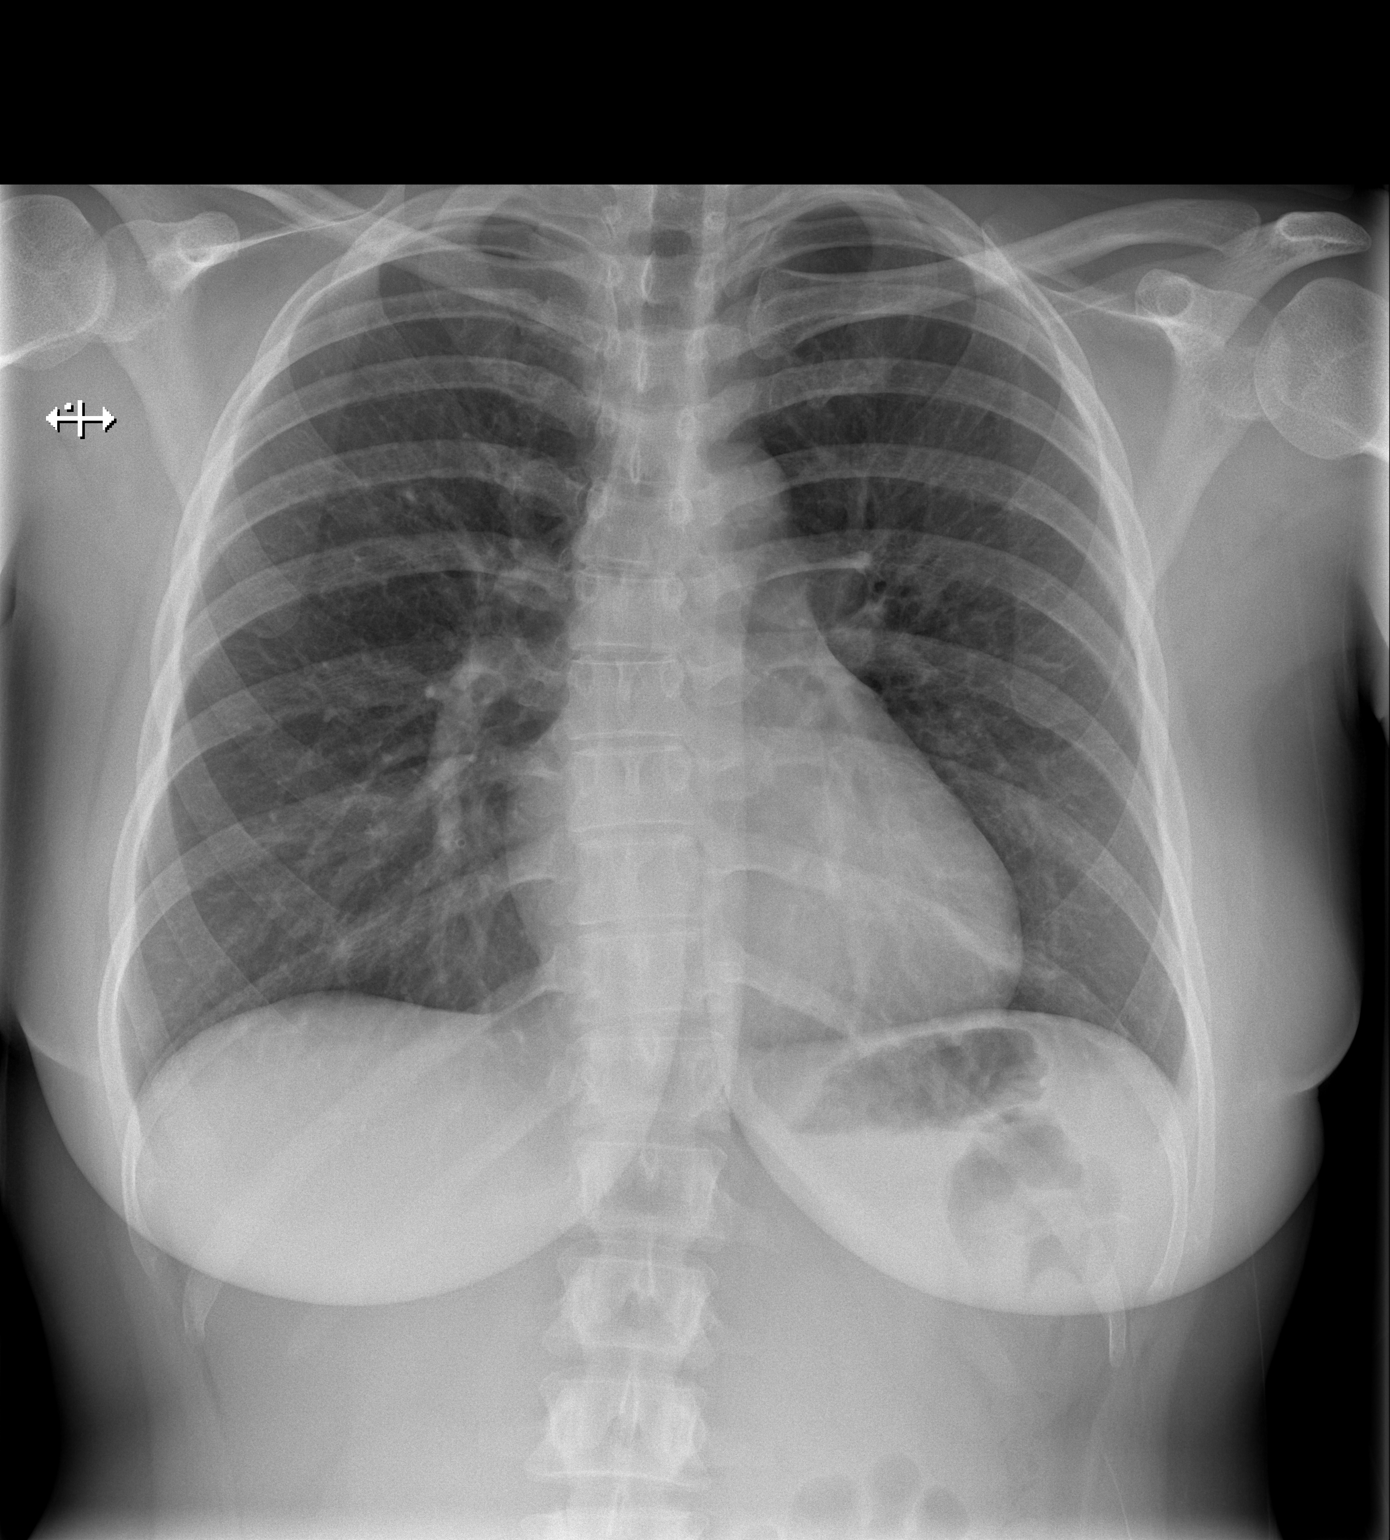

[w chest lat]
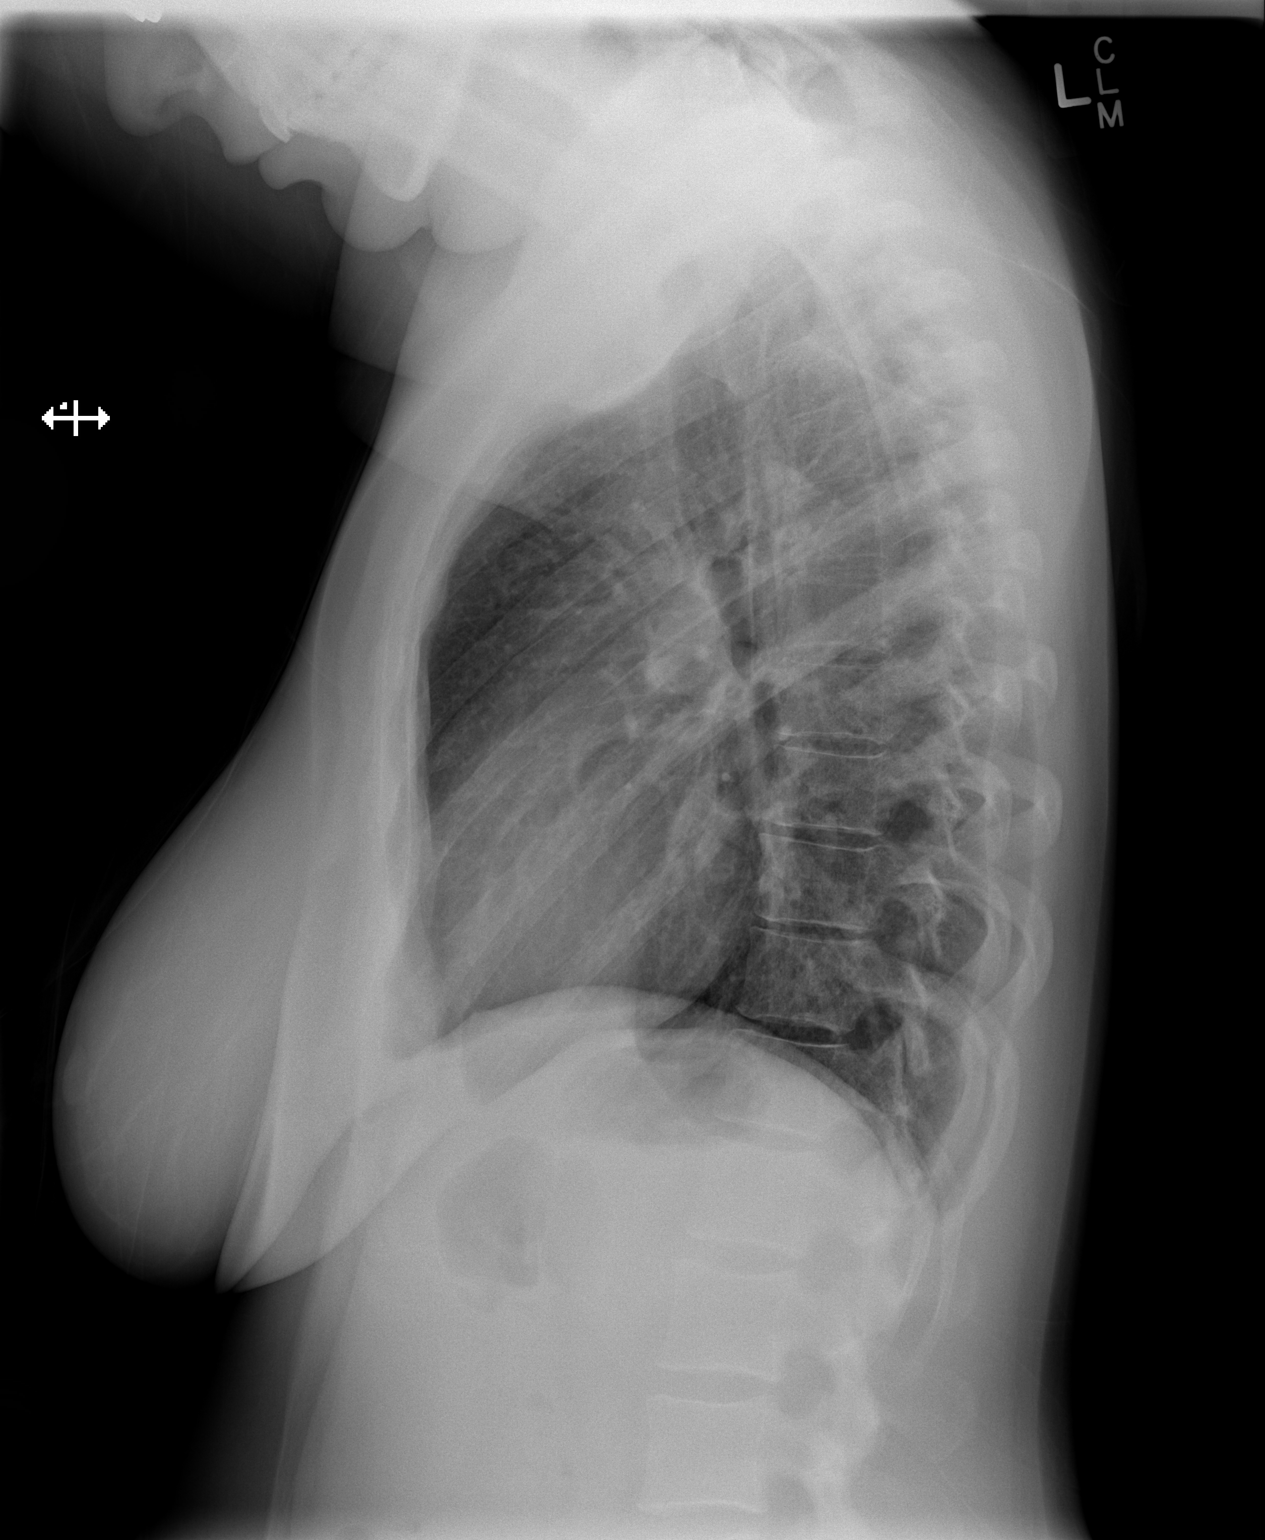

[2 of 2 positions shown; findings below may reference images not displayed]

FINDINGS: Consider medium heart

Mediastinal contours and pulmonary vascularity normal.

Minimal peribronchial thickening.

No pulmonary infiltrate, pleural effusion, or pneumothorax.

Bones unremarkable.
IMPRESSION: Minimal peribronchial thickening new since previous exam question
minimal bronchitic changes.

No acute infiltrate.

## 2013-02-02 MED ORDER — HYDROCODONE-HOMATROPINE 5-1.5 MG/5ML PO SYRP: 5.0000 mL | ORAL_SOLUTION | Freq: Four times a day (QID) | ORAL | Status: DC | PRN
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
## 2013-02-02 NOTE — ED Notes (Signed)
Pt reports cough with green mucous x 3 days, states she is also hoarse and lost her voice. Taking otc meds with no relief. Also c/o R wrist pain x 1 week after "lifting something wrong." cms intact

## 2013-02-02 NOTE — ED Notes (Signed)
Patient transported to X-ray 

## 2013-02-04 NOTE — ED Provider Notes (Signed)
Medical screening examination/treatment/procedure(s) were performed by non-physician practitioner and as supervising physician I was immediately available for consultation/collaboration.   Joya Gaskins, MD 02/04/13 2242

## 2013-06-03 ENCOUNTER — Emergency Department (HOSPITAL_COMMUNITY)
Admission: EM | Admit: 2013-06-03 | Discharge: 2013-06-03 | Disposition: A | Discharge status: Home / Self Care | Payer: Medicaid Other | Attending: Emergency Medicine | Admitting: Emergency Medicine

## 2013-06-03 ENCOUNTER — Encounter (HOSPITAL_COMMUNITY): Payer: Self-pay | Admitting: Emergency Medicine

## 2013-06-03 DIAGNOSIS — Z8669 Personal history of other diseases of the nervous system and sense organs: Secondary | ICD-10-CM | POA: Insufficient documentation

## 2013-06-03 DIAGNOSIS — R059 Cough, unspecified: Secondary | ICD-10-CM | POA: Insufficient documentation

## 2013-06-03 DIAGNOSIS — Z87891 Personal history of nicotine dependence: Secondary | ICD-10-CM | POA: Insufficient documentation

## 2013-06-03 DIAGNOSIS — Z8679 Personal history of other diseases of the circulatory system: Secondary | ICD-10-CM | POA: Insufficient documentation

## 2013-06-03 DIAGNOSIS — Z8659 Personal history of other mental and behavioral disorders: Secondary | ICD-10-CM | POA: Insufficient documentation

## 2013-06-03 DIAGNOSIS — J309 Allergic rhinitis, unspecified: Secondary | ICD-10-CM | POA: Insufficient documentation

## 2013-06-03 DIAGNOSIS — Z79899 Other long term (current) drug therapy: Secondary | ICD-10-CM | POA: Insufficient documentation

## 2013-06-03 DIAGNOSIS — Z8611 Personal history of tuberculosis: Secondary | ICD-10-CM | POA: Insufficient documentation

## 2013-06-03 DIAGNOSIS — J310 Chronic rhinitis: Secondary | ICD-10-CM

## 2013-06-03 DIAGNOSIS — R05 Cough: Secondary | ICD-10-CM | POA: Insufficient documentation

## 2013-06-03 MED ORDER — BENZONATATE 100 MG PO CAPS: 100.0000 mg | ORAL_CAPSULE | Freq: Three times a day (TID) | ORAL | Status: DC

## 2013-06-03 MED ORDER — FLUTICASONE PROPIONATE 50 MCG/ACT NA SUSP: NASAL | Status: DC

## 2013-06-03 NOTE — ED Notes (Signed)
Cough x 2 wks; nonproductive dry; no relief with otc; no fever

## 2013-06-03 NOTE — Discharge Instructions (Signed)
Continue the Zyrtec.  Cough, Adult  A cough is a reflex. It helps you clear your throat and airways. A cough can help heal your body. A cough can last 2 or 3 weeks (acute) or may last more than 8 weeks (chronic). Some common causes of a cough can include an infection, allergy, or a cold. HOME CARE  Only take medicine as told by your doctor.  If given, take your medicines (antibiotics) as told. Finish them even if you start to feel better.  Use a cold steam vaporizer or humidier in your home. This can help loosen thick spit (secretions).  Sleep so you are almost sitting up (semi-upright). Use pillows to do this. This helps reduce coughing.  Rest as needed.  Stop smoking if you smoke. GET HELP RIGHT AWAY IF:  You have yellowish-white fluid (pus) in your thick spit.  Your cough gets worse.  Your medicine does not reduce coughing, and you are losing sleep.  You cough up blood.  You have trouble breathing.  Your pain gets worse and medicine does not help.  You have a fever. MAKE SURE YOU:   Understand these instructions.  Will watch your condition.  Will get help right away if you are not doing well or get worse. Document Released: 01/05/2011 Document Revised: 07/17/2011 Document Reviewed: 01/05/2011 [facility] Patient Information 2014 [place].  Allergic Rhinitis Allergic rhinitis is when the mucous membranes in the nose respond to allergens. Allergens are particles in the air that cause your body to have an allergic reaction. This causes you to release allergic antibodies. Through a chain of events, these eventually cause you to release histamine into the blood stream. Although meant to protect the body, it is this release of histamine that causes your discomfort, such as frequent sneezing, congestion, and an itchy, runny nose.  CAUSES  Seasonal allergic rhinitis (hay fever) is caused by pollen allergens that may come from grasses, trees, and weeds. Year-round allergic  rhinitis (perennial allergic rhinitis) is caused by allergens such as house dust mites, pet dander, and mold spores.  SYMPTOMS   Nasal stuffiness (congestion).  Itchy, runny nose with sneezing and tearing of the eyes. DIAGNOSIS  Your health care provider can help you determine the allergen or allergens that trigger your symptoms. If you and your health care provider are unable to determine the allergen, skin or blood testing may be used. TREATMENT  Allergic Rhinitis does not have a cure, but it can be controlled by:  Medicines and allergy shots (immunotherapy).  Avoiding the allergen. Hay fever may often be treated with antihistamines in pill or nasal spray forms. Antihistamines block the effects of histamine. There are over-the-counter medicines that may help with nasal congestion and swelling around the eyes. Check with your health care provider before taking or giving this medicine.  If avoiding the allergen or the medicine prescribed do not work, there are many new medicines your health care provider can prescribe. Stronger medicine may be used if initial measures are ineffective. Desensitizing injections can be used if medicine and avoidance does not work. Desensitization is when a patient is given ongoing shots until the body becomes less sensitive to the allergen. Make sure you follow up with your health care provider if problems continue. HOME CARE INSTRUCTIONS It is not possible to completely avoid allergens, but you can reduce your symptoms by taking steps to limit your exposure to them. It helps to know exactly what you are allergic to so that you can avoid  your specific triggers. SEEK MEDICAL CARE IF:   You have a fever.  You develop a cough that does not stop easily (persistent).  You have shortness of breath.  You start wheezing.  Symptoms interfere with normal daily activities. Document Released: 01/17/2001 Document Revised: 02/12/2013 Document Reviewed:  12/30/2012 [facility] Patient Information 2014 [place].
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
## 2013-11-22 ENCOUNTER — Encounter (HOSPITAL_COMMUNITY): Payer: Self-pay | Admitting: Emergency Medicine

## 2013-11-22 ENCOUNTER — Emergency Department (HOSPITAL_COMMUNITY)
Admission: EM | Admit: 2013-11-22 | Discharge: 2013-11-22 | Disposition: A | Discharge status: Home / Self Care | Payer: Medicaid Other | Attending: Emergency Medicine | Admitting: Emergency Medicine

## 2013-11-22 DIAGNOSIS — L509 Urticaria, unspecified: Secondary | ICD-10-CM | POA: Insufficient documentation

## 2013-11-22 DIAGNOSIS — Z8659 Personal history of other mental and behavioral disorders: Secondary | ICD-10-CM | POA: Insufficient documentation

## 2013-11-22 DIAGNOSIS — Z87891 Personal history of nicotine dependence: Secondary | ICD-10-CM | POA: Insufficient documentation

## 2013-11-22 DIAGNOSIS — Z8611 Personal history of tuberculosis: Secondary | ICD-10-CM | POA: Insufficient documentation

## 2013-11-22 DIAGNOSIS — Z8742 Personal history of other diseases of the female genital tract: Secondary | ICD-10-CM | POA: Insufficient documentation

## 2013-11-22 DIAGNOSIS — Z8679 Personal history of other diseases of the circulatory system: Secondary | ICD-10-CM | POA: Insufficient documentation

## 2013-11-22 DIAGNOSIS — L309 Dermatitis, unspecified: Secondary | ICD-10-CM

## 2013-11-22 DIAGNOSIS — L259 Unspecified contact dermatitis, unspecified cause: Secondary | ICD-10-CM | POA: Insufficient documentation

## 2013-11-22 MED ORDER — GENTAMICIN SULFATE 0.1 % EX CREA: 1.0000 "application " | TOPICAL_CREAM | Freq: Three times a day (TID) | CUTANEOUS | Status: DC

## 2013-11-22 MED ORDER — HYDROCORTISONE 0.5 % EX CREA: 1.0000 "application " | TOPICAL_CREAM | Freq: Two times a day (BID) | CUTANEOUS | Status: DC

## 2013-11-22 NOTE — ED Provider Notes (Signed)
Medical screening examination/treatment/procedure(s) were performed by non-physician practitioner and as supervising physician I was immediately available for consultation/collaboration.   EKG Interpretation None        Hoy Morn, MD 11/22/13 2351

## 2013-11-22 NOTE — ED Notes (Signed)
Pt states she has rash to axilla area of both arms x one week. Pt states area is itchy and now rash is spreading to forearms. Pt has no acute distress.

## 2013-11-22 NOTE — Discharge Instructions (Signed)
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
## 2013-11-28 ENCOUNTER — Emergency Department (HOSPITAL_COMMUNITY)
Admission: EM | Admit: 2013-11-28 | Discharge: 2013-11-28 | Disposition: A | Discharge status: Home / Self Care | Payer: Medicaid Other | Attending: Emergency Medicine | Admitting: Emergency Medicine

## 2013-11-28 ENCOUNTER — Encounter (HOSPITAL_COMMUNITY): Payer: Self-pay | Admitting: Emergency Medicine

## 2013-11-28 DIAGNOSIS — Z8669 Personal history of other diseases of the nervous system and sense organs: Secondary | ICD-10-CM | POA: Insufficient documentation

## 2013-11-28 DIAGNOSIS — IMO0002 Reserved for concepts with insufficient information to code with codable children: Secondary | ICD-10-CM | POA: Insufficient documentation

## 2013-11-28 DIAGNOSIS — R21 Rash and other nonspecific skin eruption: Secondary | ICD-10-CM | POA: Insufficient documentation

## 2013-11-28 DIAGNOSIS — Z8611 Personal history of tuberculosis: Secondary | ICD-10-CM | POA: Insufficient documentation

## 2013-11-28 DIAGNOSIS — Z87891 Personal history of nicotine dependence: Secondary | ICD-10-CM | POA: Insufficient documentation

## 2013-11-28 DIAGNOSIS — Z792 Long term (current) use of antibiotics: Secondary | ICD-10-CM | POA: Insufficient documentation

## 2013-11-28 DIAGNOSIS — Z8659 Personal history of other mental and behavioral disorders: Secondary | ICD-10-CM | POA: Insufficient documentation

## 2013-11-28 MED ORDER — PREDNISONE 20 MG PO TABS: ORAL_TABLET | ORAL | Status: DC

## 2013-11-28 MED ORDER — DIPHENHYDRAMINE HCL 25 MG PO TABS: 25.0000 mg | ORAL_TABLET | Freq: Four times a day (QID) | ORAL | Status: DC

## 2013-11-28 NOTE — Discharge Instructions (Signed)

## 2013-11-28 NOTE — ED Provider Notes (Signed)
Medical screening examination/treatment/procedure(s) were performed by non-physician practitioner and as supervising physician I was immediately available for consultation/collaboration.     Mirna Mires, MD 11/28/13 2055

## 2013-11-28 NOTE — ED Notes (Signed)
Pt in c/o generalized rash to legs and arms, states she was seen for same a few days ago and told it may be a reaction to something, pt states symptoms have increased
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
## 2014-03-09 ENCOUNTER — Encounter (HOSPITAL_COMMUNITY): Payer: Self-pay | Admitting: Emergency Medicine

## 2014-05-28 ENCOUNTER — Encounter (HOSPITAL_COMMUNITY): Payer: Self-pay

## 2014-05-28 ENCOUNTER — Emergency Department (HOSPITAL_COMMUNITY)
Admission: EM | Admit: 2014-05-28 | Discharge: 2014-05-28 | Disposition: A | Discharge status: Home / Self Care | Payer: Medicaid Other | Attending: Emergency Medicine | Admitting: Emergency Medicine

## 2014-05-28 DIAGNOSIS — Z8611 Personal history of tuberculosis: Secondary | ICD-10-CM | POA: Insufficient documentation

## 2014-05-28 DIAGNOSIS — R1084 Generalized abdominal pain: Secondary | ICD-10-CM

## 2014-05-28 DIAGNOSIS — Z8679 Personal history of other diseases of the circulatory system: Secondary | ICD-10-CM | POA: Insufficient documentation

## 2014-05-28 DIAGNOSIS — M7701 Medial epicondylitis, right elbow: Secondary | ICD-10-CM

## 2014-05-28 DIAGNOSIS — Z8669 Personal history of other diseases of the nervous system and sense organs: Secondary | ICD-10-CM | POA: Insufficient documentation

## 2014-05-28 DIAGNOSIS — Z3202 Encounter for pregnancy test, result negative: Secondary | ICD-10-CM | POA: Insufficient documentation

## 2014-05-28 DIAGNOSIS — Z79899 Other long term (current) drug therapy: Secondary | ICD-10-CM | POA: Insufficient documentation

## 2014-05-28 DIAGNOSIS — Z8659 Personal history of other mental and behavioral disorders: Secondary | ICD-10-CM | POA: Insufficient documentation

## 2014-05-28 DIAGNOSIS — Z87891 Personal history of nicotine dependence: Secondary | ICD-10-CM | POA: Insufficient documentation

## 2014-05-28 LAB — COMPREHENSIVE METABOLIC PANEL
ALT: 18 U/L (ref 0–35)
AST: 18 U/L (ref 0–37)
Albumin: 3.6 g/dL (ref 3.5–5.2)
Alkaline Phosphatase: 47 U/L (ref 39–117)
Anion gap: 4 — ABNORMAL LOW (ref 5–15)
BUN: 14 mg/dL (ref 6–23)
CHLORIDE: 107 meq/L (ref 96–112)
CO2: 28 mmol/L (ref 19–32)
Calcium: 8.7 mg/dL (ref 8.4–10.5)
Creatinine, Ser: 0.68 mg/dL (ref 0.50–1.10)
GLUCOSE: 89 mg/dL (ref 70–99)
Potassium: 4.1 mmol/L (ref 3.5–5.1)
Sodium: 139 mmol/L (ref 135–145)
TOTAL PROTEIN: 5.7 g/dL — AB (ref 6.0–8.3)
Total Bilirubin: 0.4 mg/dL (ref 0.3–1.2)

## 2014-05-28 LAB — URINALYSIS, ROUTINE W REFLEX MICROSCOPIC
BILIRUBIN URINE: NEGATIVE
Glucose, UA: NEGATIVE mg/dL
Hgb urine dipstick: NEGATIVE
Ketones, ur: NEGATIVE mg/dL
LEUKOCYTES UA: NEGATIVE
Nitrite: NEGATIVE
Protein, ur: NEGATIVE mg/dL
SPECIFIC GRAVITY, URINE: 1.023 (ref 1.005–1.030)
Urobilinogen, UA: 0.2 mg/dL (ref 0.0–1.0)
pH: 6 (ref 5.0–8.0)

## 2014-05-28 LAB — CBC WITH DIFFERENTIAL/PLATELET
BASOS ABS: 0 10*3/uL (ref 0.0–0.1)
Basophils Relative: 1 % (ref 0–1)
Eosinophils Absolute: 0.2 10*3/uL (ref 0.0–0.7)
Eosinophils Relative: 3 % (ref 0–5)
HEMATOCRIT: 39 % (ref 36.0–46.0)
HEMOGLOBIN: 13.2 g/dL (ref 12.0–15.0)
LYMPHS ABS: 1.4 10*3/uL (ref 0.7–4.0)
LYMPHS PCT: 19 % (ref 12–46)
MCH: 30.4 pg (ref 26.0–34.0)
MCHC: 33.8 g/dL (ref 30.0–36.0)
MCV: 89.9 fL (ref 78.0–100.0)
MONO ABS: 0.6 10*3/uL (ref 0.1–1.0)
Monocytes Relative: 7 % (ref 3–12)
Neutro Abs: 5.3 10*3/uL (ref 1.7–7.7)
Neutrophils Relative %: 70 % (ref 43–77)
Platelets: 228 10*3/uL (ref 150–400)
RBC: 4.34 MIL/uL (ref 3.87–5.11)
RDW: 12.1 % (ref 11.5–15.5)
WBC: 7.6 10*3/uL (ref 4.0–10.5)

## 2014-05-28 LAB — WET PREP, GENITAL
CLUE CELLS WET PREP: NONE SEEN
TRICH WET PREP: NONE SEEN
WBC, Wet Prep HPF POC: NONE SEEN
YEAST WET PREP: NONE SEEN

## 2014-05-28 LAB — POC URINE PREG, ED: Preg Test, Ur: NEGATIVE

## 2014-05-28 MED ORDER — HYDROCODONE-ACETAMINOPHEN 5-325 MG PO TABS: 1.0000 | ORAL_TABLET | ORAL | Status: DC | PRN

## 2014-05-28 MED ORDER — ONDANSETRON HCL 4 MG PO TABS: 4.0000 mg | ORAL_TABLET | Freq: Four times a day (QID) | ORAL | Status: DC

## 2014-05-28 MED ORDER — NAPROXEN 375 MG PO TABS: 375.0000 mg | ORAL_TABLET | Freq: Two times a day (BID) | ORAL | Status: DC

## 2014-05-28 MED ORDER — OXYCODONE-ACETAMINOPHEN 5-325 MG PO TABS
1.0000 | ORAL_TABLET | Freq: Once | ORAL | Status: AC
  Administered 2014-05-28: 1 via ORAL
  Filled 2014-05-28: qty 1

## 2014-05-28 MED ORDER — ONDANSETRON 4 MG PO TBDP
4.0000 mg | ORAL_TABLET | Freq: Once | ORAL | Status: AC
  Administered 2014-05-28: 4 mg via ORAL
  Filled 2014-05-28: qty 1

## 2014-05-28 NOTE — ED Notes (Signed)
Pt remains monitored by blood pressure and pulse ox.  
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
## 2014-05-28 NOTE — ED Notes (Signed)
Pt reports lower abdominal pain with radiation to right flank x 2.5 weeks. Denies N/V/D. Reports urinary frequency and dysuria. PT also reports right elbow pain  X 1 month. States she lifts objects at works which irritates area. Denies injury. No obvious injury, full ROM. NAD.

## 2014-05-28 NOTE — Discharge Instructions (Signed)
Abdominal Pain, Women °Abdominal (stomach, pelvic, or belly) pain can be caused by many things. It is important to tell your doctor: °· The location of the pain. °· Does it come and go or is it present all the time? °· Are there things that start the pain (eating certain foods, exercise)? °· Are there other symptoms associated with the pain (fever, nausea, vomiting, diarrhea)? °All of this is helpful to know when trying to find the cause of the pain. °CAUSES  °· Stomach: virus or bacteria infection, or ulcer. °· Intestine: appendicitis (inflamed appendix), regional ileitis (Crohn's disease), ulcerative colitis (inflamed colon), irritable bowel syndrome, diverticulitis (inflamed diverticulum of the colon), or cancer of the stomach or intestine. °· Gallbladder disease or stones in the gallbladder. °· Kidney disease, kidney stones, or infection. °· Pancreas infection or cancer. °· Fibromyalgia (pain disorder). °· Diseases of the female organs: °¨ Uterus: fibroid (non-cancerous) tumors or infection. °¨ Fallopian tubes: infection or tubal pregnancy. °¨ Ovary: cysts or tumors. °¨ Pelvic adhesions (scar tissue). °¨ Endometriosis (uterus lining tissue growing in the pelvis and on the pelvic organs). °¨ Pelvic congestion syndrome (female organs filling up with blood just before the menstrual period). °¨ Pain with the menstrual period. °¨ Pain with ovulation (producing an egg). °¨ Pain with an IUD (intrauterine device, birth control) in the uterus. °¨ Cancer of the female organs. °· Functional pain (pain not caused by a disease, may improve without treatment). °· Psychological pain. °· Depression. °DIAGNOSIS  °Your doctor will decide the seriousness of your pain by doing an examination. °· Blood tests. °· X-rays. °· Ultrasound. °· CT scan (computed tomography, special type of X-ray). °· MRI (magnetic resonance imaging). °· Cultures, for infection. °· Barium enema (dye inserted in the large intestine, to better view it with  X-rays). °· Colonoscopy (looking in intestine with a lighted tube). °· Laparoscopy (minor surgery, looking in abdomen with a lighted tube). °· Major abdominal exploratory surgery (looking in abdomen with a large incision). °TREATMENT  °The treatment will depend on the cause of the pain.  °· Many cases can be observed and treated at home. °· Over-the-counter medicines recommended by your caregiver. °· Prescription medicine. °· Antibiotics, for infection. °· Birth control pills, for painful periods or for ovulation pain. °· Hormone treatment, for endometriosis. °· Nerve blocking injections. °· Physical therapy. °· Antidepressants. °· Counseling with a psychologist or psychiatrist. °· Minor or major surgery. °HOME CARE INSTRUCTIONS  °· Do not take laxatives, unless directed by your caregiver. °· Take over-the-counter pain medicine only if ordered by your caregiver. Do not take aspirin because it can cause an upset stomach or bleeding. °· Try a clear liquid diet (broth or water) as ordered by your caregiver. Slowly move to a bland diet, as tolerated, if the pain is related to the stomach or intestine. °· Have a thermometer and take your temperature several times a day, and record it. °· Bed rest and sleep, if it helps the pain. °· Avoid sexual intercourse, if it causes pain. °· Avoid stressful situations. °· Keep your follow-up appointments and tests, as your caregiver orders. °· If the pain does not go away with medicine or surgery, you may try: °¨ Acupuncture. °¨ Relaxation exercises (yoga, meditation). °¨ Group therapy. °¨ Counseling. °SEEK MEDICAL CARE IF:  °· You notice certain foods cause stomach pain. °· Your home care treatment is not helping your pain. °· You need stronger pain medicine. °· You want your IUD removed. °· You feel faint or   lightheaded. °· You develop nausea and vomiting. °· You develop a rash. °· You are having side effects or an allergy to your medicine. °SEEK IMMEDIATE MEDICAL CARE IF:  °· Your  pain does not go away or gets worse. °· You have a fever. °· Your pain is felt only in portions of the abdomen. The right side could possibly be appendicitis. The left lower portion of the abdomen could be colitis or diverticulitis. °· You are passing blood in your stools (bright red or black tarry stools, with or without vomiting). °· You have blood in your urine. °· You develop chills, with or without a fever. °· You pass out. °MAKE SURE YOU:  °· Understand these instructions. °· Will watch your condition. °· Will get help right away if you are not doing well or get worse. °Document Released: 02/19/2007 Document Revised: 09/08/2013 Document Reviewed: 03/11/2009 °ExitCare® Patient Information ©2015 [employer]. This information is not intended to replace advice given to you by your health care provider. Make sure you discuss any questions you have with your health care provider. ° °

## 2014-05-29 LAB — GC/CHLAMYDIA PROBE AMP (~~LOC~~) NOT AT ARMC
CHLAMYDIA, DNA PROBE: NEGATIVE
NEISSERIA GONORRHEA: NEGATIVE

## 2014-07-19 ENCOUNTER — Emergency Department (HOSPITAL_COMMUNITY)
Admission: EM | Admit: 2014-07-19 | Discharge: 2014-07-19 | Disposition: A | Discharge status: Home / Self Care | Payer: Medicaid Other | Attending: Emergency Medicine | Admitting: Emergency Medicine

## 2014-07-19 ENCOUNTER — Encounter (HOSPITAL_COMMUNITY): Payer: Self-pay | Admitting: Nurse Practitioner

## 2014-07-19 DIAGNOSIS — Z8669 Personal history of other diseases of the nervous system and sense organs: Secondary | ICD-10-CM | POA: Insufficient documentation

## 2014-07-19 DIAGNOSIS — J3489 Other specified disorders of nose and nasal sinuses: Secondary | ICD-10-CM | POA: Insufficient documentation

## 2014-07-19 DIAGNOSIS — Z8659 Personal history of other mental and behavioral disorders: Secondary | ICD-10-CM | POA: Insufficient documentation

## 2014-07-19 DIAGNOSIS — R059 Cough, unspecified: Secondary | ICD-10-CM

## 2014-07-19 DIAGNOSIS — R05 Cough: Secondary | ICD-10-CM | POA: Insufficient documentation

## 2014-07-19 DIAGNOSIS — J309 Allergic rhinitis, unspecified: Secondary | ICD-10-CM

## 2014-07-19 DIAGNOSIS — Z79899 Other long term (current) drug therapy: Secondary | ICD-10-CM | POA: Insufficient documentation

## 2014-07-19 DIAGNOSIS — Z87891 Personal history of nicotine dependence: Secondary | ICD-10-CM | POA: Insufficient documentation

## 2014-07-19 DIAGNOSIS — Z8611 Personal history of tuberculosis: Secondary | ICD-10-CM | POA: Insufficient documentation

## 2014-07-19 DIAGNOSIS — R0981 Nasal congestion: Secondary | ICD-10-CM | POA: Insufficient documentation

## 2014-07-19 DIAGNOSIS — Z791 Long term (current) use of non-steroidal anti-inflammatories (NSAID): Secondary | ICD-10-CM | POA: Insufficient documentation

## 2014-07-19 MED ORDER — BENZONATATE 100 MG PO CAPS: 100.0000 mg | ORAL_CAPSULE | Freq: Three times a day (TID) | ORAL | Status: DC

## 2014-07-19 MED ORDER — AMOXICILLIN 500 MG PO TABS: 1000.0000 mg | ORAL_TABLET | Freq: Two times a day (BID) | ORAL | Status: DC

## 2014-07-19 NOTE — Discharge Instructions (Signed)
Continue to stay well-hydrated. Gargle warm salt water and spit it out. Continue to alternate between Tylenol and Ibuprofen for pain or fever. Use Mucinex for cough suppression/expectoration of mucus. Use netipot and flonase to help with nasal congestion. May consider over-the-counter Benadryl or other antihistamine to decrease secretions and for watery itchy eyes. Use Tesslon pearls for cough suppression. Only start the safety script of antibiotic if your symptoms WORSEN. Followup with a primary care doctor from the list below in 5-7 days for recheck of ongoing symptoms. Return to emergency department for emergent changing or worsening of symptoms.   Cough, Adult  A cough is a reflex that helps clear your throat and airways. It can help heal the body or may be a reaction to an irritated airway. A cough may only last 2 or 3 weeks (acute) or may last more than 8 weeks (chronic).  CAUSES Acute cough:  Viral or bacterial infections. Chronic cough:  Infections.  Allergies.  Asthma.  Post-nasal drip.  Smoking.  Heartburn or acid reflux.  Some medicines.  Chronic lung problems (COPD).  Cancer. SYMPTOMS   Cough.  Fever.  Chest pain.  Increased breathing rate.  High-pitched whistling sound when breathing (wheezing).  Colored mucus that you cough up (sputum). TREATMENT   A bacterial cough may be treated with antibiotic medicine.  A viral cough must run its course and will not respond to antibiotics.  Your caregiver may recommend other treatments if you have a chronic cough. HOME CARE INSTRUCTIONS   Only take over-the-counter or prescription medicines for pain, discomfort, or fever as directed by your caregiver. Use cough suppressants only as directed by your caregiver.  Use a cold steam vaporizer or humidifier in your bedroom or home to help loosen secretions.  Sleep in a semi-upright position if your cough is worse at night.  Rest as needed.  Stop smoking if you  smoke. SEEK IMMEDIATE MEDICAL CARE IF:   You have pus in your sputum.  Your cough starts to worsen.  You cannot control your cough with suppressants and are losing sleep.  You begin coughing up blood.  You have difficulty breathing.  You develop pain which is getting worse or is uncontrolled with medicine.  You have a fever. MAKE SURE YOU:   Understand these instructions.  Will watch your condition.  Will get help right away if you are not doing well or get worse. Document Released: 10/21/2010 Document Revised: 07/17/2011 Document Reviewed: 10/21/2010 [facility] Patient Information 2015 [place], [place]. This information is not intended to replace advice given to you by your health care provider. Make sure you discuss any questions you have with your health care provider.  Allergic Rhinitis Allergic rhinitis is when the mucous membranes in the nose respond to allergens. Allergens are particles in the air that cause your body to have an allergic reaction. This causes you to release allergic antibodies. Through a chain of events, these eventually cause you to release histamine into the blood stream. Although meant to protect the body, it is this release of histamine that causes your discomfort, such as frequent sneezing, congestion, and an itchy, runny nose.  CAUSES  Seasonal allergic rhinitis (hay fever) is caused by pollen allergens that may come from grasses, trees, and weeds. Year-round allergic rhinitis (perennial allergic rhinitis) is caused by allergens such as house dust mites, pet dander, and mold spores.  SYMPTOMS   Nasal stuffiness (congestion).  Itchy, runny nose with sneezing and tearing of the eyes. DIAGNOSIS  Your health  care provider can help you determine the allergen or allergens that trigger your symptoms. If you and your health care provider are unable to determine the allergen, skin or blood testing may be used. TREATMENT  Allergic rhinitis does not have a cure,  but it can be controlled by:  Medicines and allergy shots (immunotherapy).  Avoiding the allergen. Hay fever may often be treated with antihistamines in pill or nasal spray forms. Antihistamines block the effects of histamine. There are over-the-counter medicines that may help with nasal congestion and swelling around the eyes. Check with your health care provider before taking or giving this medicine.  If avoiding the allergen or the medicine prescribed do not work, there are many new medicines your health care provider can prescribe. Stronger medicine may be used if initial measures are ineffective. Desensitizing injections can be used if medicine and avoidance does not work. Desensitization is when a patient is given ongoing shots until the body becomes less sensitive to the allergen. Make sure you follow up with your health care provider if problems continue. HOME CARE INSTRUCTIONS It is not possible to completely avoid allergens, but you can reduce your symptoms by taking steps to limit your exposure to them. It helps to know exactly what you are allergic to so that you can avoid your specific triggers. SEEK MEDICAL CARE IF:   You have a fever.  You develop a cough that does not stop easily (persistent).  You have shortness of breath.  You start wheezing.  Symptoms interfere with normal daily activities. Document Released: 01/17/2001 Document Revised: 04/29/2013 Document Reviewed: 12/30/2012 [facility] Patient Information 2015 [place], [place]. This information is not intended to replace advice given to you by your health care provider. Make sure you discuss any questions you have with your health care provider. Emergency Department Resource Guide 1) Find a Doctor and Pay Out of Pocket Although you won't have to find out who is covered by your insurance plan, it is a good idea to ask around and get recommendations. You will then need to call the office and see if the doctor you have chosen  will accept you as a new patient and what types of options they offer for patients who are self-pay. Some doctors offer discounts or will set up payment plans for their patients who do not have insurance, but you will need to ask so you aren't surprised when you get to your appointment.  2) Contact Your Local Health Department Not all health departments have doctors that can see patients for sick visits, but many do, so it is worth a call to see if yours does. If you don't know where your local health department is, you can check in your phone book. The CDC also has a tool to help you locate your state's health department, and many state websites also have listings of all of their local health departments.  3) Find a [facility] If your illness is not likely to be very severe or complicated, you may want to try a walk in clinic. These are popping up all over the country in pharmacies, drugstores, and shopping centers. They're usually staffed by nurse practitioners or physician assistants that have been trained to treat common illnesses and complaints. They're usually fairly quick and inexpensive. However, if you have serious medical issues or chronic medical problems, these are probably not your best option.  No Primary Care Doctor: - Call Health Connect at  [phone] - they can help you locate a primary  care doctor that  accepts your insurance, provides certain services, etc. - Physician Referral Service- [phone]  Chronic Pain Problems: Organization         Address  Phone   Notes  [facility]  [phone] Patients need to be referred by their primary care doctor.   Medication Assistance: Organization         Address  Phone   Notes  [facility] Medication [facility] [address] [place], [place] [address] [phone] --Must be a resident of [facility] -- Must have NO insurance coverage whatsoever (no Medicaid/ Medicare, etc.) --  The pt. MUST have a primary care doctor that directs their care regularly and follows them in the community   [facility]  [phone]   [employer]  [phone]    Agencies that provide inexpensive medical care: Organization         Address  Phone   Notes  [place]  [phone]   [facility]    [phone]   [facility] [place], [place] [address] [phone]   [place] [address] [place] [phone]   [facility]    [phone]   [facility]    [phone]   [place] [address], [place] Phone:  [phone], Fax:  [phone] Hours of Operation:  9 am - 6 pm, M-F.  Also accepts Medicaid/Medicare and self-pay.  [facility] for [address], [address], [place] Phone: [phone], Fax: [phone]. Hours of Operation:  8:30 am - 5:30 pm, M-F.  Also accepts Medicaid and self-pay.  [facility] [address], [place] Phone: [phone]   [place], [place], [place] [phone], Ext. [phone] Mondays & Thursdays: 7-9 AM.  First 15 patients are seen on a first come, first serve basis.    [place] Providers:  Organization         Address  Phone   Notes  [facility] [address], [place] [phone] Also accepts self-pay patients.  [facility] [address], [place]  [phone]   [place], [address], [place] [phone]   [facility] [address], [place] [phone]   Lucianne Lei [address], [place]   [phone] Only accepts [place] [facility] [place] patients after they have their name applied to their card.   Self-Pay (no insurance)  in [facility]:  Organization         Address  Phone   Notes  Sickle Cell Patients, [facility] [place] [phone]   [facility] [place] [phone]   [facility] [place]  [place], [place], [place] [phone]   [facility]/Dr. Osei-Bonsu  [address], [place] or [address], [place] [phone] Phone number for both [place] and [place] locations is the same.  [facility] [address], [place] [phone]   [facility] [address] [place] or [phone]  [facility] [place] [phone], phone; [phone], fax Sees patients 1st and 3rd Saturday of every month.  Must not qualify for public or private insurance (i.e. Medicaid, Medicare, [place] Health Choice, Veterans' Benefits)  Household income should be no more than 200% of the poverty level The clinic cannot treat you if you are pregnant or think you are pregnant  Sexually transmitted diseases are not treated at the clinic.
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
## 2014-07-19 NOTE — ED Notes (Signed)
Declined W/C at D/C and was escorted to lobby by RN. 

## 2014-07-19 NOTE — ED Notes (Signed)
She had nasal/sinus congestion last week and has had a cough since. She has taken mucinex and allegra with no relief of cough. Denies fevers.

## 2014-07-20 ENCOUNTER — Telehealth: Payer: Self-pay | Admitting: *Deleted

## 2014-07-20 NOTE — Telephone Encounter (Signed)
Pharmacy wanted to change tablets to capsules as the capsules are cheaper.  Permission granted.

## 2015-05-17 ENCOUNTER — Encounter (HOSPITAL_COMMUNITY): Payer: Self-pay | Admitting: *Deleted

## 2015-05-17 ENCOUNTER — Emergency Department (HOSPITAL_COMMUNITY)
Admission: EM | Admit: 2015-05-17 | Discharge: 2015-05-17 | Disposition: A | Discharge status: Home / Self Care | Payer: Medicaid Other | Attending: Emergency Medicine | Admitting: Emergency Medicine

## 2015-05-17 DIAGNOSIS — Z791 Long term (current) use of non-steroidal anti-inflammatories (NSAID): Secondary | ICD-10-CM | POA: Insufficient documentation

## 2015-05-17 DIAGNOSIS — F329 Major depressive disorder, single episode, unspecified: Secondary | ICD-10-CM | POA: Insufficient documentation

## 2015-05-17 DIAGNOSIS — Z87891 Personal history of nicotine dependence: Secondary | ICD-10-CM | POA: Insufficient documentation

## 2015-05-17 DIAGNOSIS — Z79899 Other long term (current) drug therapy: Secondary | ICD-10-CM | POA: Insufficient documentation

## 2015-05-17 DIAGNOSIS — Z8669 Personal history of other diseases of the nervous system and sense organs: Secondary | ICD-10-CM | POA: Insufficient documentation

## 2015-05-17 DIAGNOSIS — Z8611 Personal history of tuberculosis: Secondary | ICD-10-CM | POA: Insufficient documentation

## 2015-05-17 DIAGNOSIS — M62838 Other muscle spasm: Secondary | ICD-10-CM

## 2015-05-17 DIAGNOSIS — M542 Cervicalgia: Secondary | ICD-10-CM | POA: Insufficient documentation

## 2015-05-17 DIAGNOSIS — Z8679 Personal history of other diseases of the circulatory system: Secondary | ICD-10-CM | POA: Insufficient documentation

## 2015-05-17 DIAGNOSIS — Z792 Long term (current) use of antibiotics: Secondary | ICD-10-CM | POA: Insufficient documentation

## 2015-05-17 MED ORDER — METHOCARBAMOL 500 MG PO TABS
1000.0000 mg | ORAL_TABLET | Freq: Once | ORAL | Status: AC
  Administered 2015-05-17: 1000 mg via ORAL
  Filled 2015-05-17: qty 2

## 2015-05-17 MED ORDER — IBUPROFEN 400 MG PO TABS
800.0000 mg | ORAL_TABLET | Freq: Once | ORAL | Status: AC
  Administered 2015-05-17: 800 mg via ORAL
  Filled 2015-05-17: qty 2

## 2015-05-17 MED ORDER — METHOCARBAMOL 500 MG PO TABS: 500.0000 mg | ORAL_TABLET | Freq: Two times a day (BID) | ORAL | Status: DC

## 2015-05-17 MED ORDER — IBUPROFEN 800 MG PO TABS: 800.0000 mg | ORAL_TABLET | Freq: Three times a day (TID) | ORAL | Status: DC

## 2015-05-17 NOTE — Discharge Instructions (Signed)
Ms. [name] [name],  Nice meeting you! Please follow-up with a primary care provider within two weeks. Return to the emergency department if you develop increasing pain, shortness of breath. Feel better soon!  S. Wendie Simmer, PA-C

## 2015-05-17 NOTE — ED Notes (Signed)
Pt reports left shoulder and neck pain x 4 months, denies any injury but now has difficulty lifting objects. No acute distress noted.

## 2015-05-17 NOTE — ED Notes (Signed)
Declined W/C at D/C and was escorted to lobby by RN. 
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
## 2016-05-09 IMAGING — US US PELVIS COMPLETE
1 series · 13 of 25 positions shown · non-contrast
Comparison: Ob ultrasound [DATE]

CLINICAL DATA: 35-year-old female with chronic pelvic pain. LMP
[DATE].

EXAM:
TRANSABDOMINAL AND TRANSVAGINAL ULTRASOUND OF PELVIS
DOPPLER ULTRASOUND OF OVARIES
TECHNIQUE: Both transabdominal and transvaginal ultrasound examinations of the
pelvis were performed. Transabdominal technique was performed for
global imaging of the pelvis including uterus, ovaries, adnexal
regions, and pelvic cul-de-sac.
It was necessary to proceed with endovaginal exam following the
transabdominal exam to visualize the ovaries. Color and duplex
Doppler ultrasound was utilized to evaluate blood flow to the
ovaries.

[Series 1: us pelvis complete · 0.22mm/px · 13 of 103 slices shown]
[im 1/103]
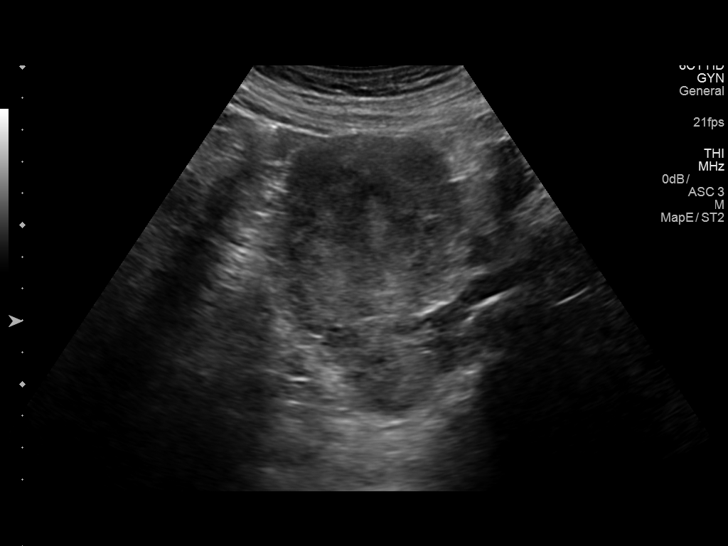
[im 9/103]
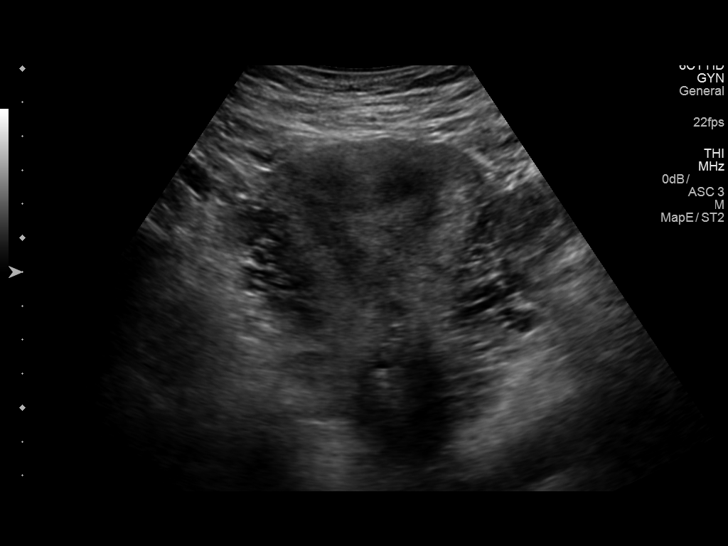
[im 18/103]
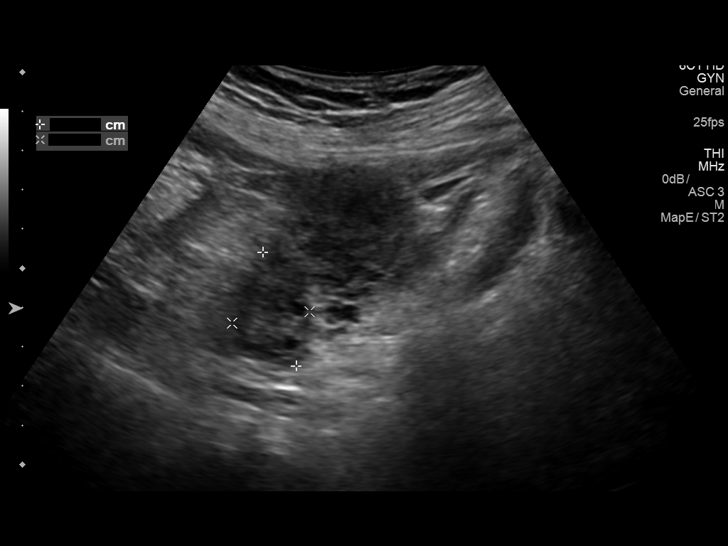
[im 26/103]
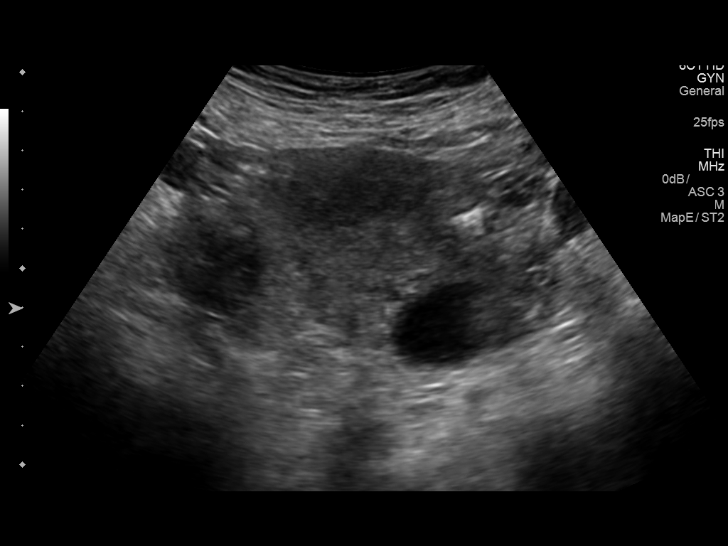
[im 35/103]
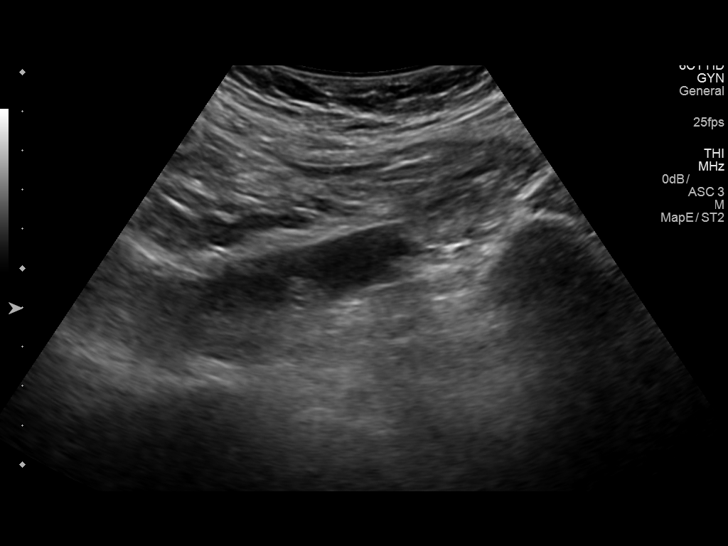
[im 43/103]
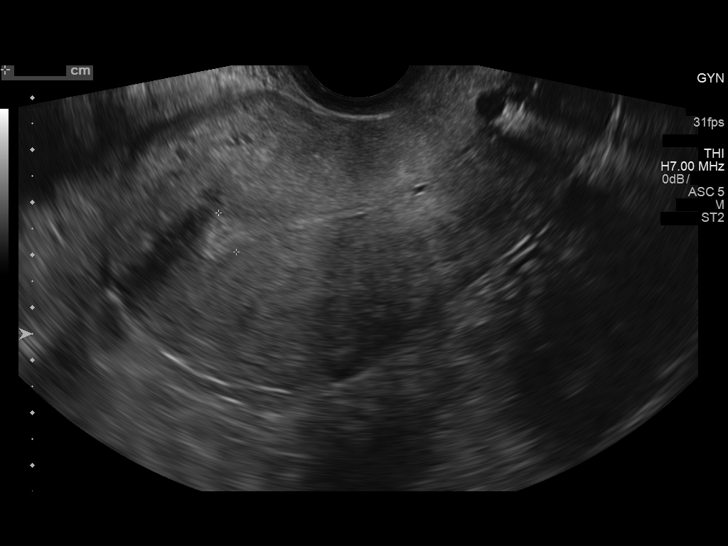
[im 52/103]
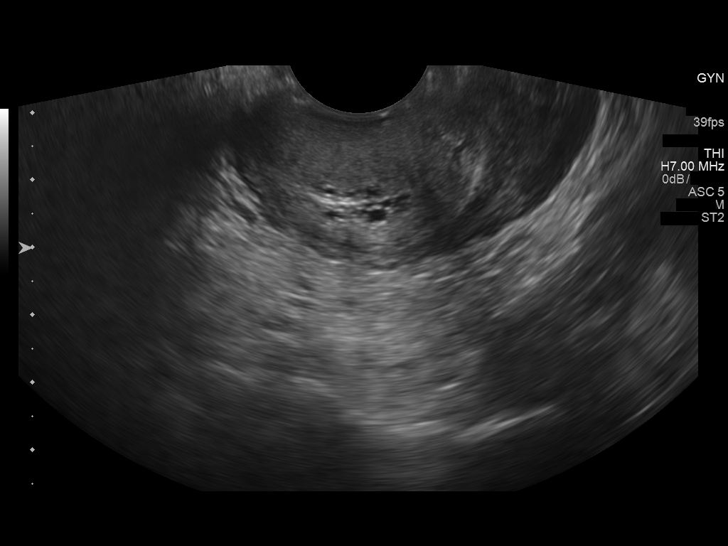
[im 60/103]
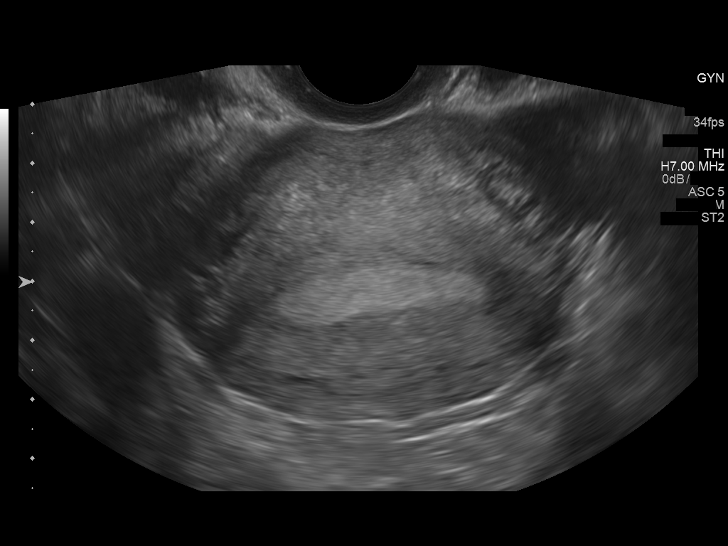
[im 69/103]
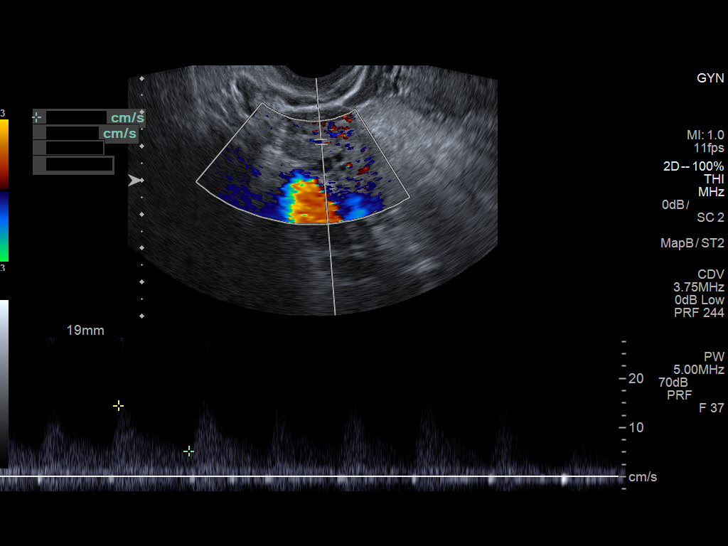
[im 77/103]
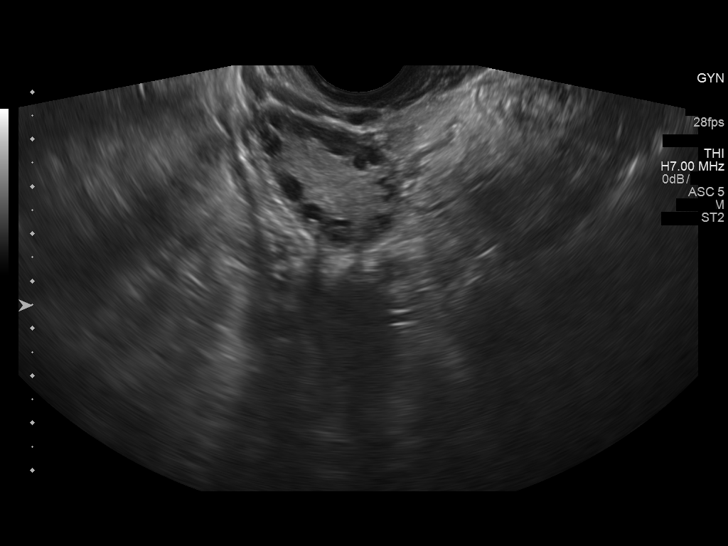
[im 86/103]
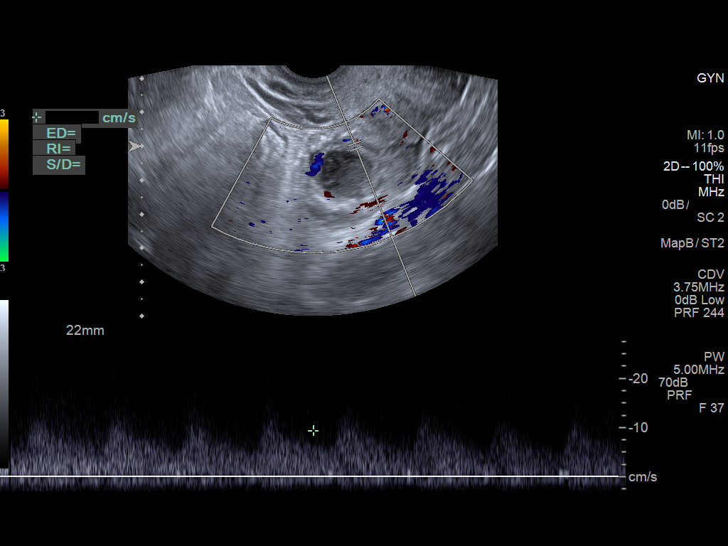
[im 94/103]
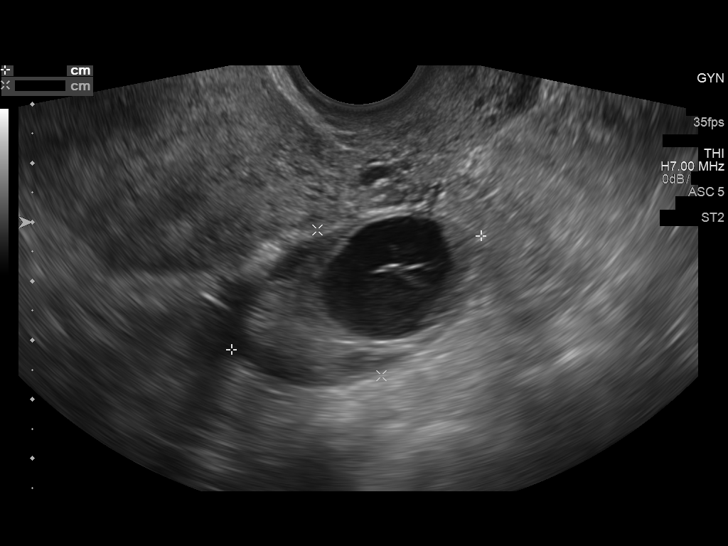
[im 103/103]
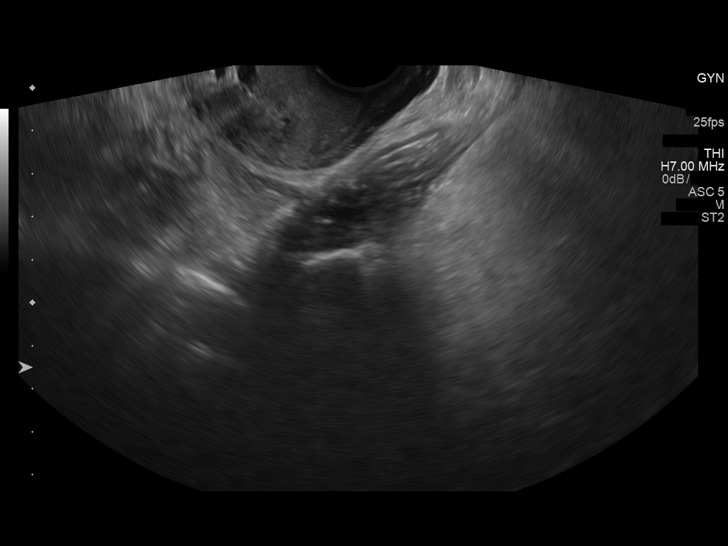

[13 of 25 positions shown; findings below may reference images not displayed]

FINDINGS: Uterus

Measurements: 10.2 x 5.8 x 7.1 cm. No fibroids or other mass
visualized. Incidental nabothian cysts.

Endometrium

Thickness: 8 mm.  No focal abnormality visualized.

Right ovary

Measurements: 4.1 x 2.1 x 2.0 cm. Multiple small peripherally
located follicles (image 71). Otherwise normal appearance/no adnexal
mass.

Left ovary

Measurements: 4.6 x 2.7 x 2.8 cm. 2.3 cm cyst with reticular pattern
of internal echoes and no internal vascular elements (images 82 and
83). Otherwise normal appearance/no adnexal mass.

Pulsed Doppler evaluation of both ovaries demonstrates normal
low-resistance arterial and venous waveforms.

Other findings

Trace simple appearing free fluid about the left ovary.
IMPRESSION: Normal uterus and no evidence of ovarian mass or torsion.

Incidental hemorrhagic left ovarian cyst and physiologic appearing
trace pelvic free fluid.

## 2016-07-19 ENCOUNTER — Emergency Department (HOSPITAL_COMMUNITY): Payer: Medicaid Other

## 2016-07-19 ENCOUNTER — Emergency Department (HOSPITAL_COMMUNITY)
Admission: EM | Admit: 2016-07-19 | Discharge: 2016-07-19 | Disposition: A | Discharge status: Home / Self Care | Payer: Medicaid Other | Attending: Emergency Medicine | Admitting: Emergency Medicine

## 2016-07-19 ENCOUNTER — Encounter (HOSPITAL_COMMUNITY): Payer: Self-pay | Admitting: *Deleted

## 2016-07-19 DIAGNOSIS — Z87891 Personal history of nicotine dependence: Secondary | ICD-10-CM | POA: Insufficient documentation

## 2016-07-19 DIAGNOSIS — R102 Pelvic and perineal pain: Secondary | ICD-10-CM | POA: Insufficient documentation

## 2016-07-19 DIAGNOSIS — Z79899 Other long term (current) drug therapy: Secondary | ICD-10-CM | POA: Insufficient documentation

## 2016-07-19 LAB — COMPREHENSIVE METABOLIC PANEL
ALK PHOS: 61 U/L (ref 38–126)
ALT: 20 U/L (ref 14–54)
AST: 18 U/L (ref 15–41)
Albumin: 4.3 g/dL (ref 3.5–5.0)
Anion gap: 4 — ABNORMAL LOW (ref 5–15)
BUN: 12 mg/dL (ref 6–20)
CALCIUM: 8.8 mg/dL — AB (ref 8.9–10.3)
CO2: 25 mmol/L (ref 22–32)
Chloride: 109 mmol/L (ref 101–111)
Creatinine, Ser: 0.74 mg/dL (ref 0.44–1.00)
GFR calc non Af Amer: >60 mL/min (ref >60)
Glucose, Bld: 99 mg/dL (ref 65–99)
POTASSIUM: 3.9 mmol/L (ref 3.5–5.1)
Sodium: 138 mmol/L (ref 135–145)
TOTAL PROTEIN: 7.5 g/dL (ref 6.5–8.1)
Total Bilirubin: 0.5 mg/dL (ref 0.3–1.2)

## 2016-07-19 LAB — URINALYSIS, ROUTINE W REFLEX MICROSCOPIC
BACTERIA UA: NONE SEEN
Bilirubin Urine: NEGATIVE
Glucose, UA: NEGATIVE mg/dL
KETONES UR: NEGATIVE mg/dL
Leukocytes, UA: NEGATIVE
Nitrite: NEGATIVE
Protein, ur: NEGATIVE mg/dL
Specific Gravity, Urine: 1.026 (ref 1.005–1.030)
pH: 5 (ref 5.0–8.0)

## 2016-07-19 LAB — CBC
HCT: 39.5 % (ref 36.0–46.0)
Hemoglobin: 13.3 g/dL (ref 12.0–15.0)
MCH: 28.9 pg (ref 26.0–34.0)
MCHC: 33.7 g/dL (ref 30.0–36.0)
MCV: 85.7 fL (ref 78.0–100.0)
Platelets: 235 10*3/uL (ref 150–400)
RBC: 4.61 MIL/uL (ref 3.87–5.11)
RDW: 12.8 % (ref 11.5–15.5)
WBC: 7.3 10*3/uL (ref 4.0–10.5)

## 2016-07-19 LAB — WET PREP, GENITAL
CLUE CELLS WET PREP: NONE SEEN
Sperm: NONE SEEN
Trich, Wet Prep: NONE SEEN
YEAST WET PREP: NONE SEEN

## 2016-07-19 LAB — PREGNANCY, URINE: Preg Test, Ur: NEGATIVE

## 2016-07-19 MED ORDER — ACETAMINOPHEN 500 MG PO TABS
1000.0000 mg | ORAL_TABLET | Freq: Once | ORAL | Status: AC
  Administered 2016-07-19: 1000 mg via ORAL
  Filled 2016-07-19: qty 2

## 2016-07-19 MED ORDER — IBUPROFEN 800 MG PO TABS
800.0000 mg | ORAL_TABLET | Freq: Once | ORAL | Status: AC
  Administered 2016-07-19: 800 mg via ORAL
  Filled 2016-07-19: qty 1

## 2016-07-19 MED ORDER — OXYCODONE HCL 5 MG PO TABS
5.0000 mg | ORAL_TABLET | Freq: Once | ORAL | Status: AC
  Administered 2016-07-19: 5 mg via ORAL
  Filled 2016-07-19: qty 1

## 2016-07-19 NOTE — ED Triage Notes (Addendum)
Pt reports R pelvic pain which she has had intermittently for "years."  States it is more constant and has been going on for 4 days.  Pt denies any vaginal bleeding or n/v.  Reports seeing someone in the past but states she did not have any tests done and was only given pain meds.  No hx of ovarian cysts

## 2016-07-19 NOTE — Discharge Instructions (Signed)
Take 4 over the counter ibuprofen tablets 3 times a day or 2 over-the-counter naproxen tablets twice a day for pain. Also take tylenol 1000mg(2 extra strength) four times a day.    
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
## 2016-07-19 NOTE — ED Notes (Signed)
US at bedside

## 2016-07-20 LAB — GC/CHLAMYDIA PROBE AMP (~~LOC~~) NOT AT ARMC
Chlamydia: NEGATIVE
NEISSERIA GONORRHEA: NEGATIVE

## 2016-07-20 LAB — HIV ANTIBODY (ROUTINE TESTING W REFLEX): HIV SCREEN 4TH GENERATION: NONREACTIVE

## 2016-07-20 LAB — RPR: RPR Ser Ql: NONREACTIVE

## 2016-07-31 ENCOUNTER — Ambulatory Visit: Payer: Medicaid Other | Admitting: Family Medicine

## 2016-08-09 ENCOUNTER — Ambulatory Visit (INDEPENDENT_AMBULATORY_CARE_PROVIDER_SITE_OTHER): Payer: Self-pay | Admitting: Obstetrics & Gynecology

## 2016-08-09 VITALS — BP 119/75 | HR 73 | Ht 62.0 in | Wt 175.3 lb

## 2016-08-09 DIAGNOSIS — N941 Unspecified dyspareunia: Secondary | ICD-10-CM

## 2016-08-09 DIAGNOSIS — R102 Pelvic and perineal pain: Secondary | ICD-10-CM

## 2016-08-09 MED ORDER — NORGESTIMATE-ETH ESTRADIOL 0.25-35 MG-MCG PO TABS
1.0000 | ORAL_TABLET | Freq: Every day | ORAL | 11 refills | Status: DC
Start: 2016-08-09 — End: 2018-08-30

## 2016-08-09 NOTE — Progress Notes (Signed)
History:  36 y.o. [id number] here today for ED f/u.  Pt was seen 2 week prev for   Pt works in the [employer]. She was seen in the ED but, does not feel that they eval her completely.  She reports pain on her right side into her groin.  She also c/o pain with intercourse.  It is always on the rihgt side and occurs with deep thrusting.     The following portions of the patient's history were reviewed and updated as appropriate: allergies, current medications, past family history, past medical history, past social history, past surgical history and problem list.  Review of Systems:  Pertinent items are noted in HPI.   Objective:  Physical Exam Blood pressure 119/75, pulse 73, height 5\' 2"  (1.575 m), weight 175 lb 4.8 oz (79.5 kg). BP 119/75   Pulse 73   Ht 5\' 2"  (1.575 m)   Wt 175 lb 4.8 oz (79.5 kg)   BMI 32.06 kg/m  CONSTITUTIONAL: Well-developed, well-nourished female in no acute distress.  HENT:  Normocephalic, atraumatic EYES: Conjunctivae and EOM are normal. No scleral icterus.  NECK: Normal range of motion SKIN: Skin is warm and dry. No rash noted. Not diaphoretic.No pallor. [place]: Alert and oriented to person, place, and time. Normal coordination.  Abd: Soft, nontender and nondistended Back: non tender; no CVAT Pelvic: Normal appearing external genitalia; no speculum done. Small uterus, no other palpable masses, no uterine tenderness; + tenderness over right ovary.  Labs and Imaging US Transvaginal Non-ob  Result Date: 07/19/2016 CLINICAL DATA:  36 year old female with chronic pelvic pain. LMP 06/26/2016. EXAM: TRANSABDOMINAL AND TRANSVAGINAL ULTRASOUND OF PELVIS DOPPLER ULTRASOUND OF OVARIES TECHNIQUE: Both transabdominal and transvaginal ultrasound examinations of the pelvis were performed. Transabdominal technique was performed for global imaging of the pelvis including uterus, ovaries, adnexal regions, and pelvic cul-de-sac. It was necessary to proceed with  endovaginal exam following the transabdominal exam to visualize the ovaries. Color and duplex Doppler ultrasound was utilized to evaluate blood flow to the ovaries. COMPARISON:  Ob ultrasound 03/21/2012 FINDINGS: Uterus Measurements: 10.2 x 5.8 x 7.1 cm. No fibroids or other mass visualized. Incidental nabothian cysts. Endometrium Thickness: 8 mm.  No focal abnormality visualized. Right ovary Measurements: 4.1 x 2.1 x 2.0 cm. Multiple small peripherally located follicles (image 71). Otherwise normal appearance/no adnexal mass. Left ovary Measurements: 4.6 x 2.7 x 2.8 cm. 2.3 cm cyst with reticular pattern of internal echoes and no internal vascular elements (images 82 and 83). Otherwise normal appearance/no adnexal mass. Pulsed Doppler evaluation of both ovaries demonstrates normal low-resistance arterial and venous waveforms. Other findings Trace simple appearing free fluid about the left ovary. IMPRESSION: Normal uterus and no evidence of ovarian mass or torsion. Incidental hemorrhagic left ovarian cyst and physiologic appearing trace pelvic free fluid. Electronically Signed   By: Genevie Ann M.D.   On: 07/19/2016 15:52   US Pelvis Complete  Result Date: 07/19/2016 CLINICAL DATA:  36 year old female with chronic pelvic pain. LMP 06/26/2016. EXAM: TRANSABDOMINAL AND TRANSVAGINAL ULTRASOUND OF PELVIS DOPPLER ULTRASOUND OF OVARIES TECHNIQUE: Both transabdominal and transvaginal ultrasound examinations of the pelvis were performed. Transabdominal technique was performed for global imaging of the pelvis including uterus, ovaries, adnexal regions, and pelvic cul-de-sac. It was necessary to proceed with endovaginal exam following the transabdominal exam to visualize the ovaries. Color and duplex Doppler ultrasound was utilized to evaluate blood flow to the ovaries. COMPARISON:  Ob ultrasound 03/21/2012 FINDINGS: Uterus Measurements: 10.2 x 5.8 x 7.1 cm. No  fibroids or other mass visualized. Incidental nabothian cysts.  Endometrium Thickness: 8 mm.  No focal abnormality visualized. Right ovary Measurements: 4.1 x 2.1 x 2.0 cm. Multiple small peripherally located follicles (image 71). Otherwise normal appearance/no adnexal mass. Left ovary Measurements: 4.6 x 2.7 x 2.8 cm. 2.3 cm cyst with reticular pattern of internal echoes and no internal vascular elements (images 82 and 83). Otherwise normal appearance/no adnexal mass. Pulsed Doppler evaluation of both ovaries demonstrates normal low-resistance arterial and venous waveforms. Other findings Trace simple appearing free fluid about the left ovary. IMPRESSION: Normal uterus and no evidence of ovarian mass or torsion. Incidental hemorrhagic left ovarian cyst and physiologic appearing trace pelvic free fluid. Electronically Signed   By: Genevie Ann M.D.   On: 07/19/2016 15:52   [place] Art/ven Flow Abd Pelv Doppler  Result Date: 07/19/2016 CLINICAL DATA:  36 year old female with chronic pelvic pain. LMP 06/26/2016. EXAM: TRANSABDOMINAL AND TRANSVAGINAL ULTRASOUND OF PELVIS DOPPLER ULTRASOUND OF OVARIES TECHNIQUE: Both transabdominal and transvaginal ultrasound examinations of the pelvis were performed. Transabdominal technique was performed for global imaging of the pelvis including uterus, ovaries, adnexal regions, and pelvic cul-de-sac. It was necessary to proceed with endovaginal exam following the transabdominal exam to visualize the ovaries. Color and duplex Doppler ultrasound was utilized to evaluate blood flow to the ovaries. COMPARISON:  Ob ultrasound 03/21/2012 FINDINGS: Uterus Measurements: 10.2 x 5.8 x 7.1 cm. No fibroids or other mass visualized. Incidental nabothian cysts. Endometrium Thickness: 8 mm.  No focal abnormality visualized. Right ovary Measurements: 4.1 x 2.1 x 2.0 cm. Multiple small peripherally located follicles (image 71). Otherwise normal appearance/no adnexal mass. Left ovary Measurements: 4.6 x 2.7 x 2.8 cm. 2.3 cm cyst with reticular pattern of internal  echoes and no internal vascular elements (images 82 and 83). Otherwise normal appearance/no adnexal mass. Pulsed Doppler evaluation of both ovaries demonstrates normal low-resistance arterial and venous waveforms. Other findings Trace simple appearing free fluid about the left ovary. IMPRESSION: Normal uterus and no evidence of ovarian mass or torsion. Incidental hemorrhagic left ovarian cyst and physiologic appearing trace pelvic free fluid. Electronically Signed   By: Genevie Ann M.D.   On: 07/19/2016 15:52    Assessment & Plan:  Pelvic pain-  Suspect from OV follicle.  Sprintec 1 po q day  Will do a trial for 6 months and have pt f/u  Dyspareunia  Suspect from pain from right ov.  Sprintec as above   Reviewed with pt all of the tests that were done in the ED.  She felt better about her visit there.  Cheyne Bungert L. Harraway-Smith, M.D., [facility] June

## 2016-08-09 NOTE — Patient Instructions (Signed)
Oral Contraception Use Oral contraceptive pills (OCPs) are medicines taken to prevent pregnancy. OCPs work by preventing the ovaries from releasing eggs. The hormones in OCPs also cause the cervical mucus to thicken, preventing the sperm from entering the uterus. The hormones also cause the uterine lining to become thin, not allowing a fertilized egg to attach to the inside of the uterus. OCPs are highly effective when taken exactly as prescribed. However, OCPs do not prevent sexually transmitted diseases (STDs). Safe sex practices, such as using condoms along with an OCP, can help prevent STDs. Before taking OCPs, you may have a physical exam and Pap test. Your health care provider may also order blood tests if necessary. Your health care provider will make sure you are a good candidate for oral contraception. Discuss with your health care provider the possible side effects of the OCP you may be prescribed. When starting an OCP, it can take 2 to 3 months for the body to adjust to the changes in hormone levels in your body. How to take oral contraceptive pills Your health care provider may advise you on how to start taking the first cycle of OCPs. Otherwise, you can:  Start on day 1 of your menstrual period. You will not need any backup contraceptive protection with this start time.  Start on the first Sunday after your menstrual period or the day you get your prescription. In these cases, you will need to use backup contraceptive protection for the first week.  Start the pill at any time of your cycle. If you take the pill within 5 days of the start of your period, you are protected against pregnancy right away. In this case, you will not need a backup form of birth control. If you start at any other time of your menstrual cycle, you will need to use another form of birth control for 7 days. If your OCP is the type called a minipill, it will protect you from pregnancy after taking it for 2 days (48  hours).  After you have started taking OCPs:  If you forget to take 1 pill, take it as soon as you remember. Take the next pill at the regular time.  If you miss 2 or more pills, call your health care provider because different pills have different instructions for missed doses. Use backup birth control until your next menstrual period starts.  If you use a 28-day pack that contains inactive pills and you miss 1 of the last 7 pills (pills with no hormones), it will not matter. Throw away the rest of the non-hormone pills and start a new pill pack.  No matter which day you start the OCP, you will always start a new pack on that same day of the week. Have an extra pack of OCPs and a backup contraceptive method available in case you miss some pills or lose your OCP pack. Follow these instructions at home:  Do not smoke.  Always use a condom to protect against STDs. OCPs do not protect against STDs.  Use a calendar to mark your menstrual period days.  Read the information and directions that came with your OCP. Talk to your health care provider if you have questions. Contact a health care provider if:  You develop nausea and vomiting.  You have abnormal vaginal discharge or bleeding.  You develop a rash.  You miss your menstrual period.  You are losing your hair.  You need treatment for mood swings or depression.  You   get dizzy when taking the OCP.  You develop acne from taking the OCP.  You become pregnant. Get help right away if:  You develop chest pain.  You develop shortness of breath.  You have an uncontrolled or severe headache.  You develop numbness or slurred speech.  You develop visual problems.  You develop pain, redness, and swelling in the legs. This information is not intended to replace advice given to you by your health care provider. Make sure you discuss any questions you have with your health care provider. Document Released: 04/13/2011 Document  Revised: 09/30/2015 Document Reviewed: 10/13/2012 Elsevier Interactive Patient Education  2017 [employer]  

## 2016-08-09 NOTE — Progress Notes (Signed)
Patient states of pelvic pains started 3 weeks ago for 2 days at a level of 9 on the right side. She went to [facility] and  was told they saw cyst on her left side of her ovaries and was told to take Tylenol and Neproxin for pain which helped with the pain.

## 2017-06-12 DIAGNOSIS — Y999 Unspecified external cause status: Secondary | ICD-10-CM | POA: Insufficient documentation

## 2017-06-12 DIAGNOSIS — Y939 Activity, unspecified: Secondary | ICD-10-CM | POA: Diagnosis not present

## 2017-06-12 DIAGNOSIS — S9031XA Contusion of right foot, initial encounter: Secondary | ICD-10-CM | POA: Diagnosis not present

## 2017-06-12 DIAGNOSIS — W228XXA Striking against or struck by other objects, initial encounter: Secondary | ICD-10-CM | POA: Insufficient documentation

## 2017-06-12 DIAGNOSIS — Z87891 Personal history of nicotine dependence: Secondary | ICD-10-CM | POA: Insufficient documentation

## 2017-06-12 DIAGNOSIS — Y929 Unspecified place or not applicable: Secondary | ICD-10-CM | POA: Diagnosis not present

## 2017-06-12 DIAGNOSIS — S99921A Unspecified injury of right foot, initial encounter: Secondary | ICD-10-CM | POA: Diagnosis present

## 2017-06-13 ENCOUNTER — Encounter (HOSPITAL_COMMUNITY): Payer: Self-pay | Admitting: Emergency Medicine

## 2017-06-13 ENCOUNTER — Emergency Department (HOSPITAL_COMMUNITY): Payer: 59

## 2017-06-13 ENCOUNTER — Emergency Department (HOSPITAL_COMMUNITY)
Admission: EM | Admit: 2017-06-13 | Discharge: 2017-06-13 | Disposition: A | Discharge status: Home / Self Care | Payer: 59 | Attending: Emergency Medicine | Admitting: Emergency Medicine

## 2017-06-13 DIAGNOSIS — S9031XA Contusion of right foot, initial encounter: Secondary | ICD-10-CM

## 2017-06-13 IMAGING — CR DG FOOT COMPLETE 3+V*R*
3 series · 3 of 3 positions shown · non-contrast
Comparison: None.

CLINICAL DATA: Injury bruising to the third metatarsal

EXAM:
RIGHT FOOT COMPLETE - 3+ VIEW

[x foot ap right]
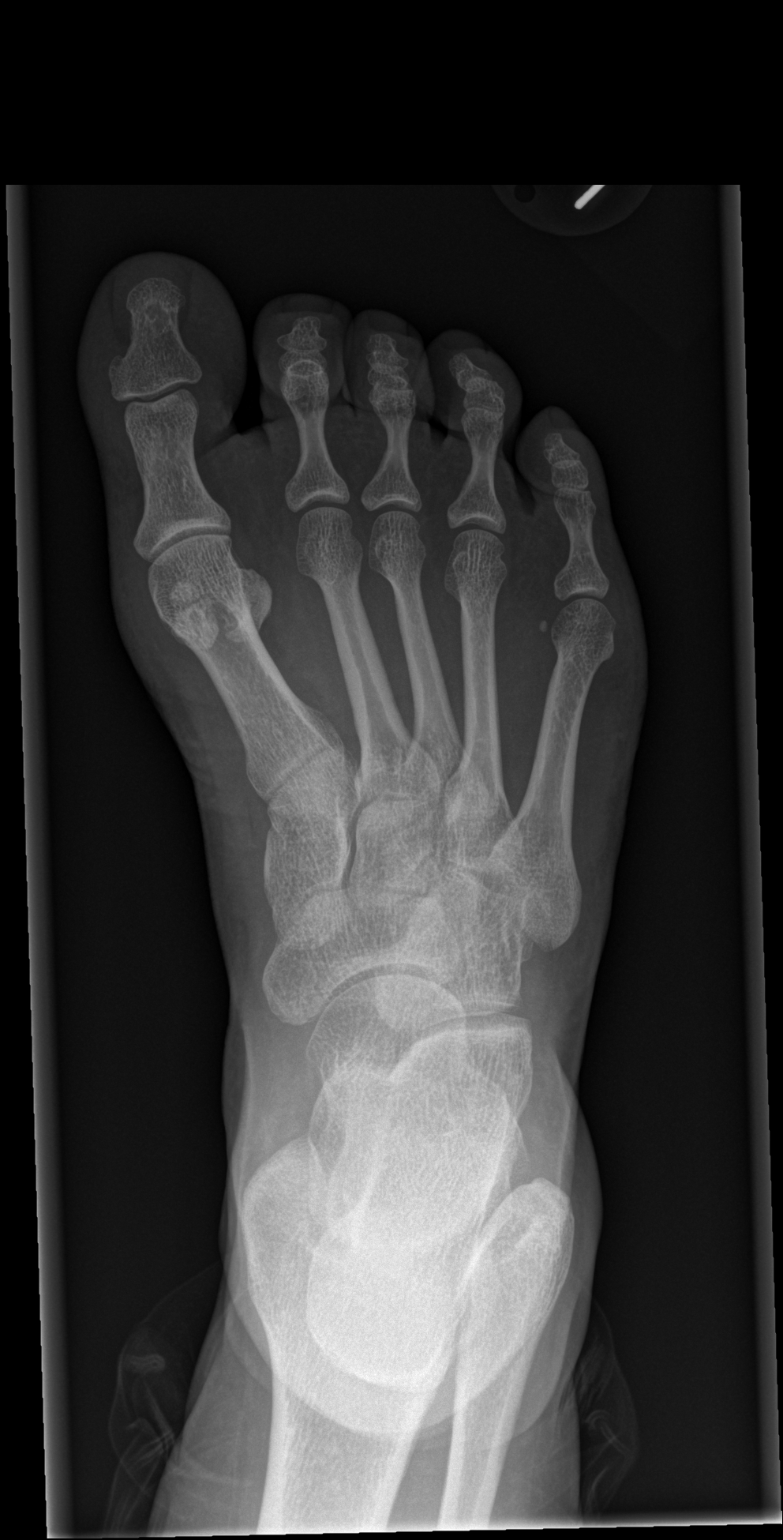

[x foot obl right]
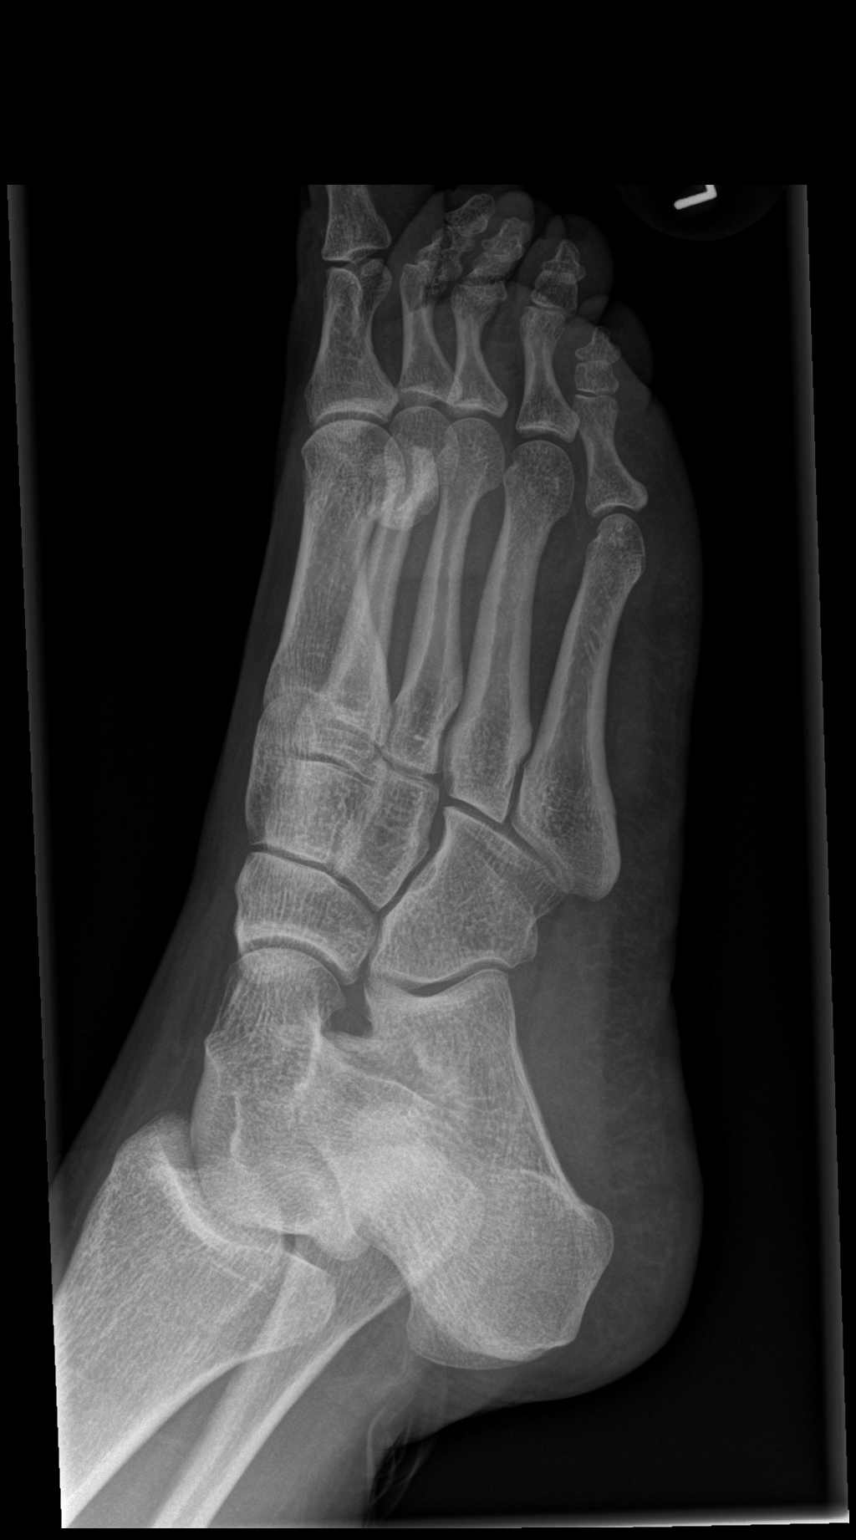

[x foot lat right]
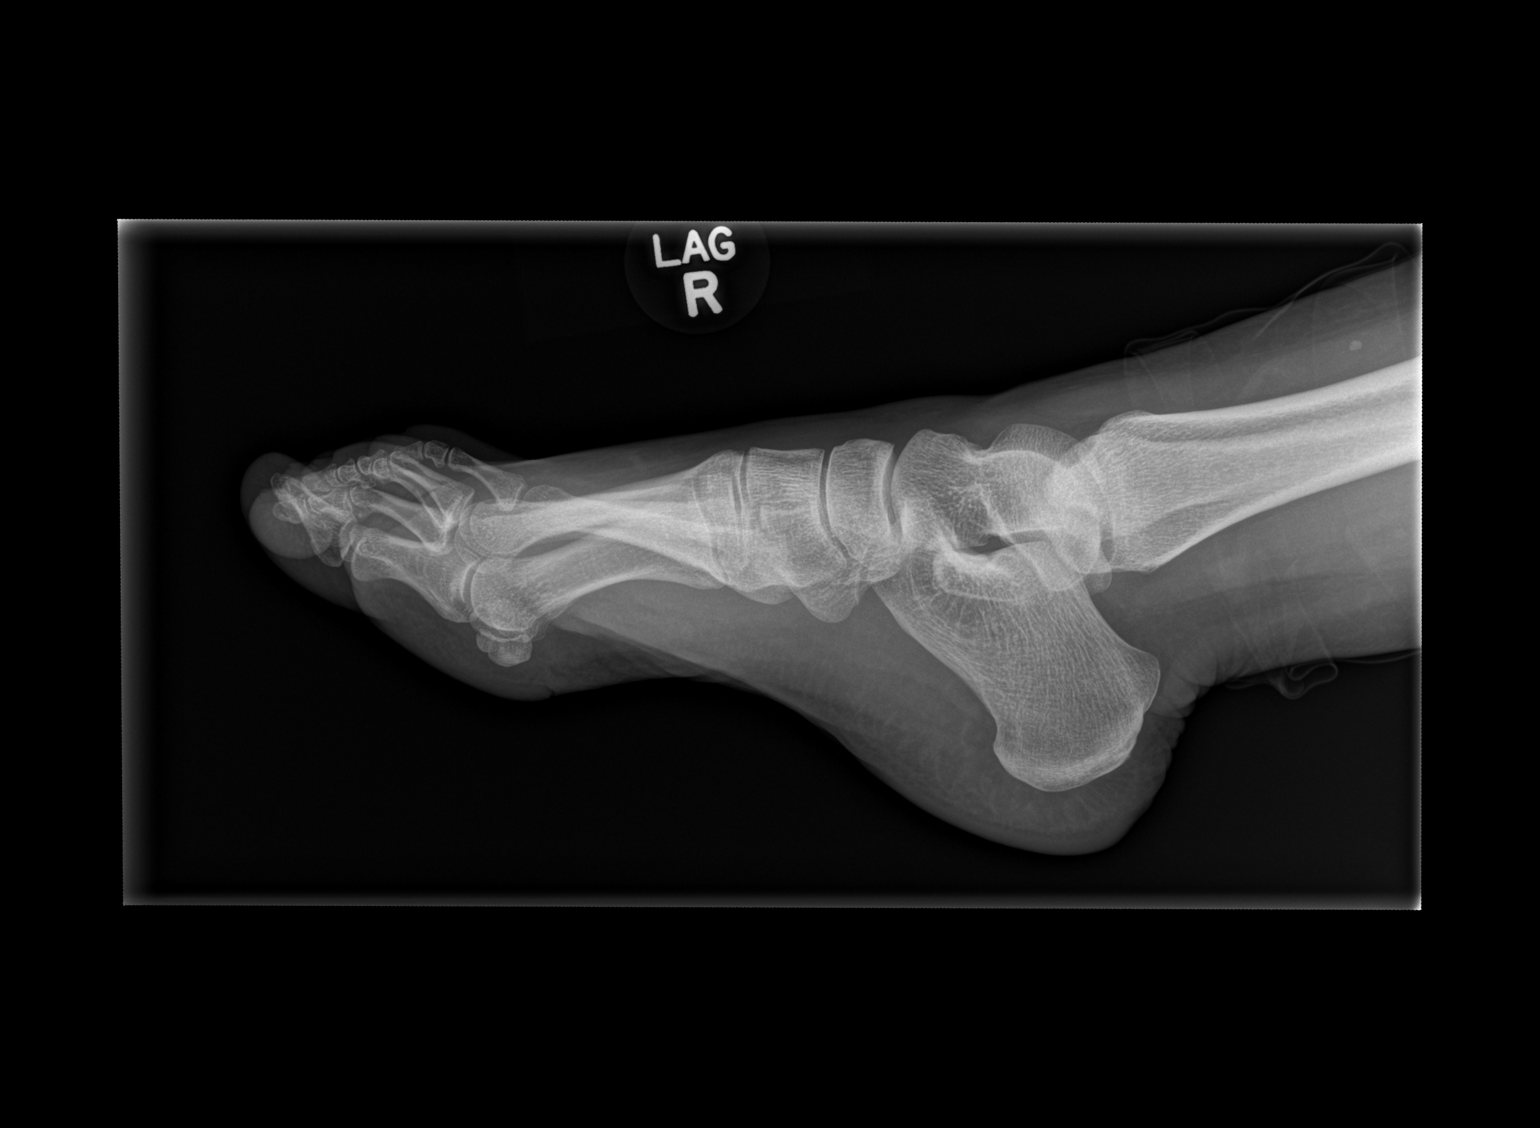

[3 of 3 positions shown; findings below may reference images not displayed]

FINDINGS: There is no evidence of fracture or dislocation. There is no
evidence of arthropathy or other focal bone abnormality. Soft
tissues are unremarkable.
IMPRESSION: Negative.

## 2017-06-13 MED ORDER — IBUPROFEN 600 MG PO TABS: 600.0000 mg | ORAL_TABLET | Freq: Four times a day (QID) | ORAL | 0 refills | Status: DC | PRN
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
## 2017-06-13 NOTE — ED Notes (Signed)
Bed: WA06 Expected date:  Expected time:  Means of arrival:  Comments: 

## 2018-02-25 ENCOUNTER — Encounter (HOSPITAL_COMMUNITY): Payer: Self-pay

## 2018-02-25 ENCOUNTER — Emergency Department (HOSPITAL_COMMUNITY): Payer: Self-pay

## 2018-02-25 ENCOUNTER — Emergency Department (HOSPITAL_COMMUNITY)
Admission: EM | Admit: 2018-02-25 | Discharge: 2018-02-26 | Disposition: A | Discharge status: Home / Self Care | Payer: Self-pay | Attending: Emergency Medicine | Admitting: Emergency Medicine

## 2018-02-25 ENCOUNTER — Other Ambulatory Visit: Payer: Self-pay

## 2018-02-25 DIAGNOSIS — R079 Chest pain, unspecified: Secondary | ICD-10-CM | POA: Insufficient documentation

## 2018-02-25 DIAGNOSIS — R42 Dizziness and giddiness: Secondary | ICD-10-CM | POA: Insufficient documentation

## 2018-02-25 DIAGNOSIS — Z5321 Procedure and treatment not carried out due to patient leaving prior to being seen by health care provider: Secondary | ICD-10-CM | POA: Insufficient documentation

## 2018-02-25 LAB — I-STAT BETA HCG BLOOD, ED (MC, WL, AP ONLY)

## 2018-02-25 LAB — BASIC METABOLIC PANEL
Anion gap: 6 (ref 5–15)
BUN: 9 mg/dL (ref 6–20)
CALCIUM: 9 mg/dL (ref 8.9–10.3)
CO2: 23 mmol/L (ref 22–32)
CREATININE: 0.71 mg/dL (ref 0.44–1.00)
Chloride: 108 mmol/L (ref 98–111)
GFR calc non Af Amer: >60 mL/min (ref >60)
Glucose, Bld: 81 mg/dL (ref 70–99)
Potassium: 3.7 mmol/L (ref 3.5–5.1)
SODIUM: 137 mmol/L (ref 135–145)

## 2018-02-25 LAB — I-STAT TROPONIN, ED: Troponin i, poc: 0 ng/mL (ref 0.00–0.08)

## 2018-02-25 LAB — CBC
HCT: 42 % (ref 36.0–46.0)
Hemoglobin: 13.9 g/dL (ref 12.0–15.0)
MCH: 29.6 pg (ref 26.0–34.0)
MCHC: 33.1 g/dL (ref 30.0–36.0)
MCV: 89.6 fL (ref 80.0–100.0)
NRBC: 0 % (ref 0.0–0.2)
PLATELETS: 235 10*3/uL (ref 150–400)
RBC: 4.69 MIL/uL (ref 3.87–5.11)
RDW: 12.5 % (ref 11.5–15.5)
WBC: 10.1 10*3/uL (ref 4.0–10.5)

## 2018-02-25 IMAGING — DX DG CHEST 2V
2 series · 2 of 2 positions shown · non-contrast
Comparison: Chest radiograph [DATE]

CLINICAL DATA: Chest pain and dizziness since this morning.

EXAM:
CHEST - 2 VIEW

[w chest pa]
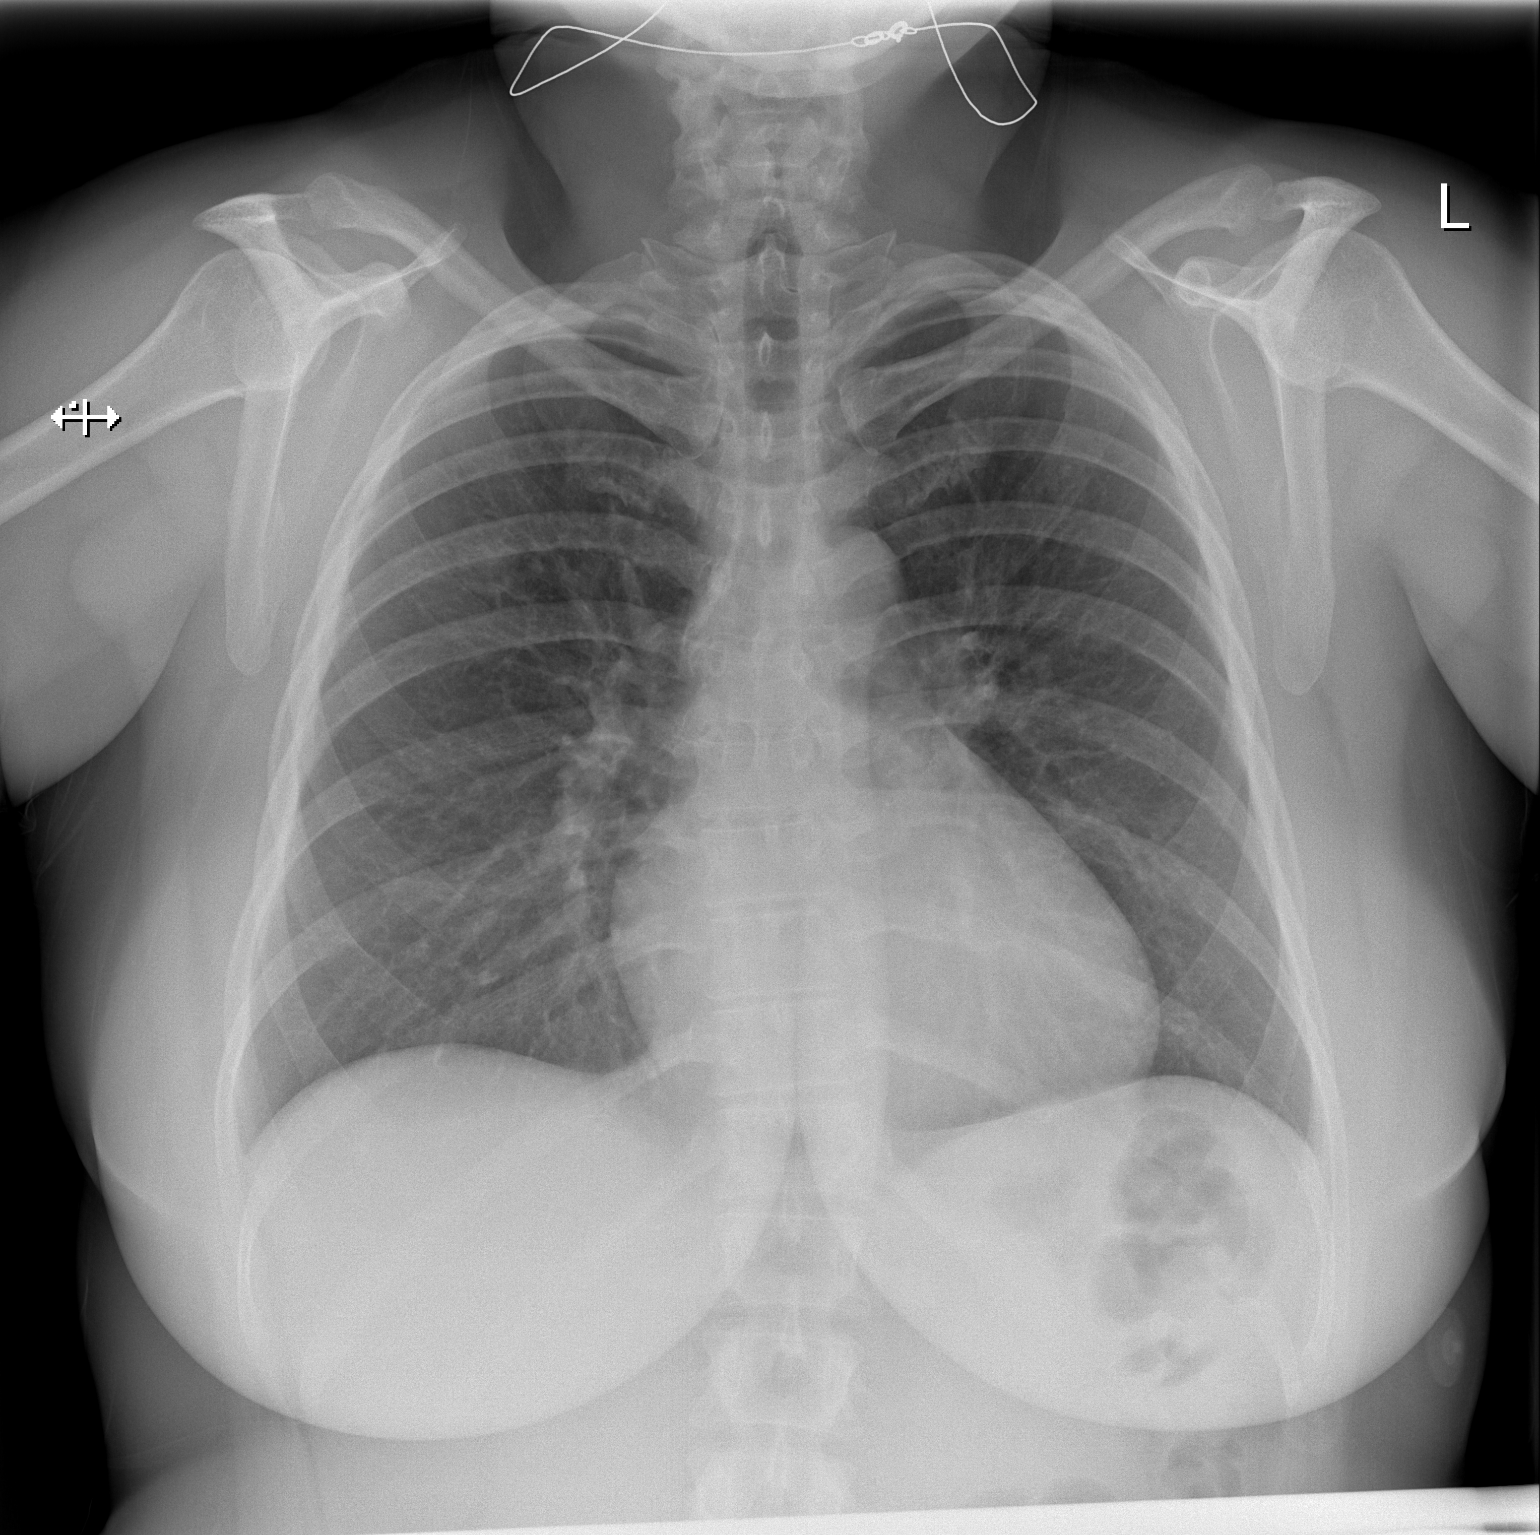

[w chest lat]
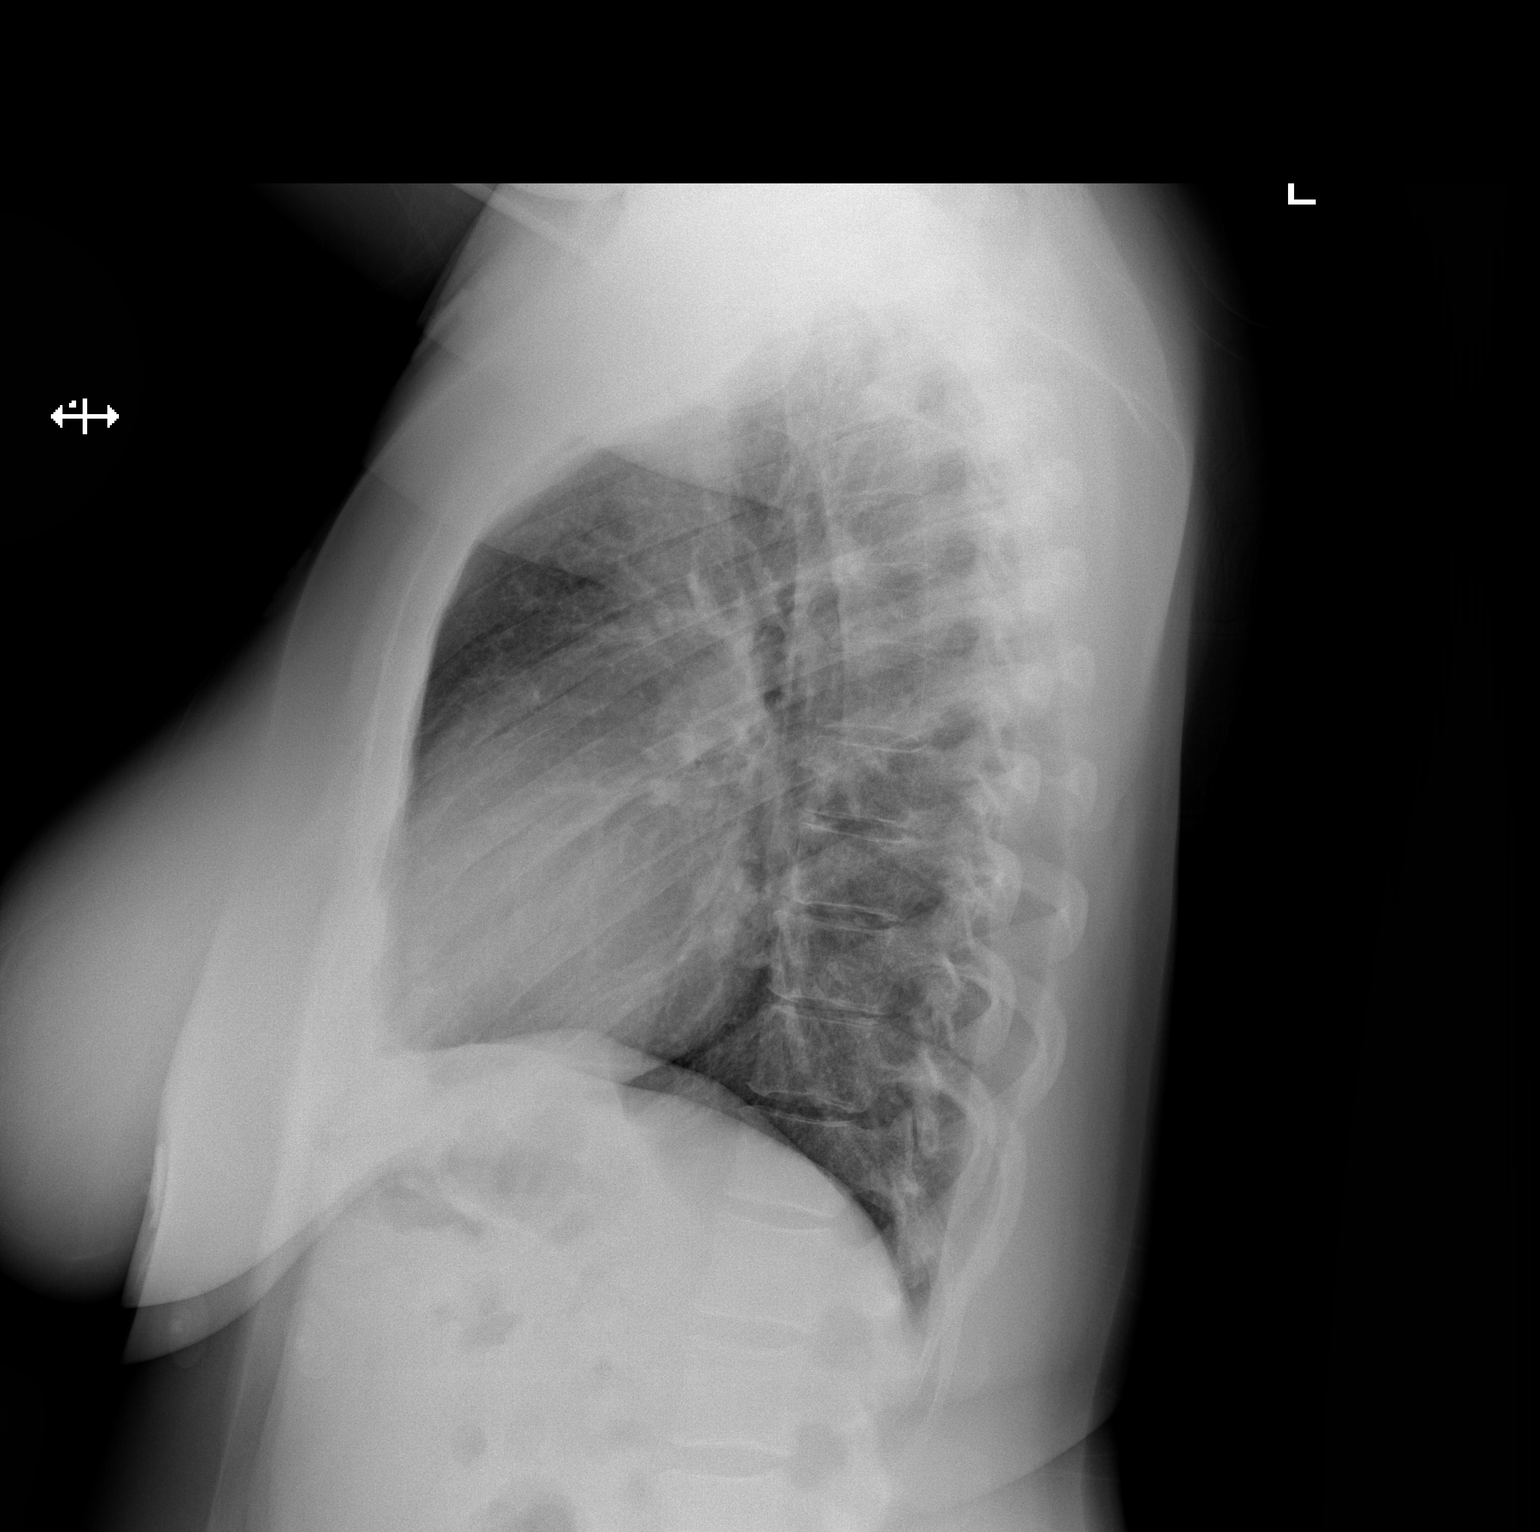

[2 of 2 positions shown; findings below may reference images not displayed]

FINDINGS: Cardiomediastinal silhouette is normal. No pleural effusions or
focal consolidations. Trachea projects midline and there is no
pneumothorax. Soft tissue planes and included osseous structures are
non-suspicious. Mild scoliosis may be positional.
IMPRESSION: Negative.

## 2018-02-25 NOTE — ED Triage Notes (Signed)
Pt here for evaluation of chest pain and dizziness that stated this morning.  Stated that she had caffeine this am before this all started but he dizziness has persisted all day. A&Ox4.

## 2018-02-26 NOTE — ED Notes (Signed)
Pt to nurse first asking about test results and why some have gone back before her. Advised pt of triage process. Pt requesting room to sleep in. Advised pt there are no rooms available for this. Pt wishes to leave, encouraged to stay but ultimately decided to leave and check her results on mychart.

## 2018-08-30 ENCOUNTER — Other Ambulatory Visit: Payer: Self-pay

## 2018-08-30 ENCOUNTER — Encounter (HOSPITAL_COMMUNITY): Payer: Self-pay | Admitting: Emergency Medicine

## 2018-08-30 ENCOUNTER — Emergency Department (HOSPITAL_COMMUNITY)
Admission: EM | Admit: 2018-08-30 | Discharge: 2018-08-30 | Disposition: A | Discharge status: Home / Self Care | Payer: BLUE CROSS/BLUE SHIELD | Attending: Emergency Medicine | Admitting: Emergency Medicine

## 2018-08-30 ENCOUNTER — Emergency Department (HOSPITAL_COMMUNITY): Payer: BLUE CROSS/BLUE SHIELD

## 2018-08-30 DIAGNOSIS — Z87891 Personal history of nicotine dependence: Secondary | ICD-10-CM | POA: Diagnosis not present

## 2018-08-30 DIAGNOSIS — F329 Major depressive disorder, single episode, unspecified: Secondary | ICD-10-CM | POA: Diagnosis not present

## 2018-08-30 DIAGNOSIS — R079 Chest pain, unspecified: Secondary | ICD-10-CM | POA: Diagnosis not present

## 2018-08-30 DIAGNOSIS — R0602 Shortness of breath: Secondary | ICD-10-CM | POA: Diagnosis not present

## 2018-08-30 DIAGNOSIS — Z793 Long term (current) use of hormonal contraceptives: Secondary | ICD-10-CM | POA: Diagnosis not present

## 2018-08-30 DIAGNOSIS — Z6379 Other stressful life events affecting family and household: Secondary | ICD-10-CM | POA: Diagnosis not present

## 2018-08-30 DIAGNOSIS — F419 Anxiety disorder, unspecified: Secondary | ICD-10-CM

## 2018-08-30 DIAGNOSIS — R0789 Other chest pain: Secondary | ICD-10-CM | POA: Diagnosis not present

## 2018-08-30 LAB — CBC
HCT: 40.5 % (ref 36.0–46.0)
Hemoglobin: 13.4 g/dL (ref 12.0–15.0)
MCH: 29.8 pg (ref 26.0–34.0)
MCHC: 33.1 g/dL (ref 30.0–36.0)
MCV: 90 fL (ref 80.0–100.0)
Platelets: 247 10*3/uL (ref 150–400)
RBC: 4.5 MIL/uL (ref 3.87–5.11)
RDW: 12.6 % (ref 11.5–15.5)
WBC: 7.5 10*3/uL (ref 4.0–10.5)
nRBC: 0 % (ref 0.0–0.2)

## 2018-08-30 LAB — COMPREHENSIVE METABOLIC PANEL
ALT: 31 U/L (ref 0–44)
AST: 19 U/L (ref 15–41)
Albumin: 3.7 g/dL (ref 3.5–5.0)
Alkaline Phosphatase: 66 U/L (ref 38–126)
Anion gap: 11 (ref 5–15)
BUN: 12 mg/dL (ref 6–20)
CO2: 22 mmol/L (ref 22–32)
Calcium: 8.9 mg/dL (ref 8.9–10.3)
Chloride: 106 mmol/L (ref 98–111)
Creatinine, Ser: 0.66 mg/dL (ref 0.44–1.00)
GFR calc Af Amer: >60 mL/min (ref >60)
GFR calc non Af Amer: >60 mL/min (ref >60)
Glucose, Bld: 112 mg/dL — ABNORMAL HIGH (ref 70–99)
Potassium: 4.1 mmol/L (ref 3.5–5.1)
Sodium: 139 mmol/L (ref 135–145)
Total Bilirubin: 0.6 mg/dL (ref 0.3–1.2)
Total Protein: 6.5 g/dL (ref 6.5–8.1)

## 2018-08-30 LAB — RAPID URINE DRUG SCREEN, HOSP PERFORMED
Amphetamines: NOT DETECTED
Barbiturates: NOT DETECTED
Benzodiazepines: NOT DETECTED
Cocaine: NOT DETECTED
Opiates: NOT DETECTED
Tetrahydrocannabinol: NOT DETECTED

## 2018-08-30 LAB — I-STAT BETA HCG BLOOD, ED (MC, WL, AP ONLY): I-stat hCG, quantitative: <5 m[IU]/mL (ref <5)

## 2018-08-30 LAB — TROPONIN I: Troponin I: <0.03 ng/mL (ref <0.03)

## 2018-08-30 IMAGING — CR CHEST - 2 VIEW
2 series · 2 of 2 positions shown · non-contrast
Comparison: None.

CLINICAL DATA: Consistent chest pressor for 4 weeks.

EXAM:
CHEST - 2 VIEW

[chest pa]
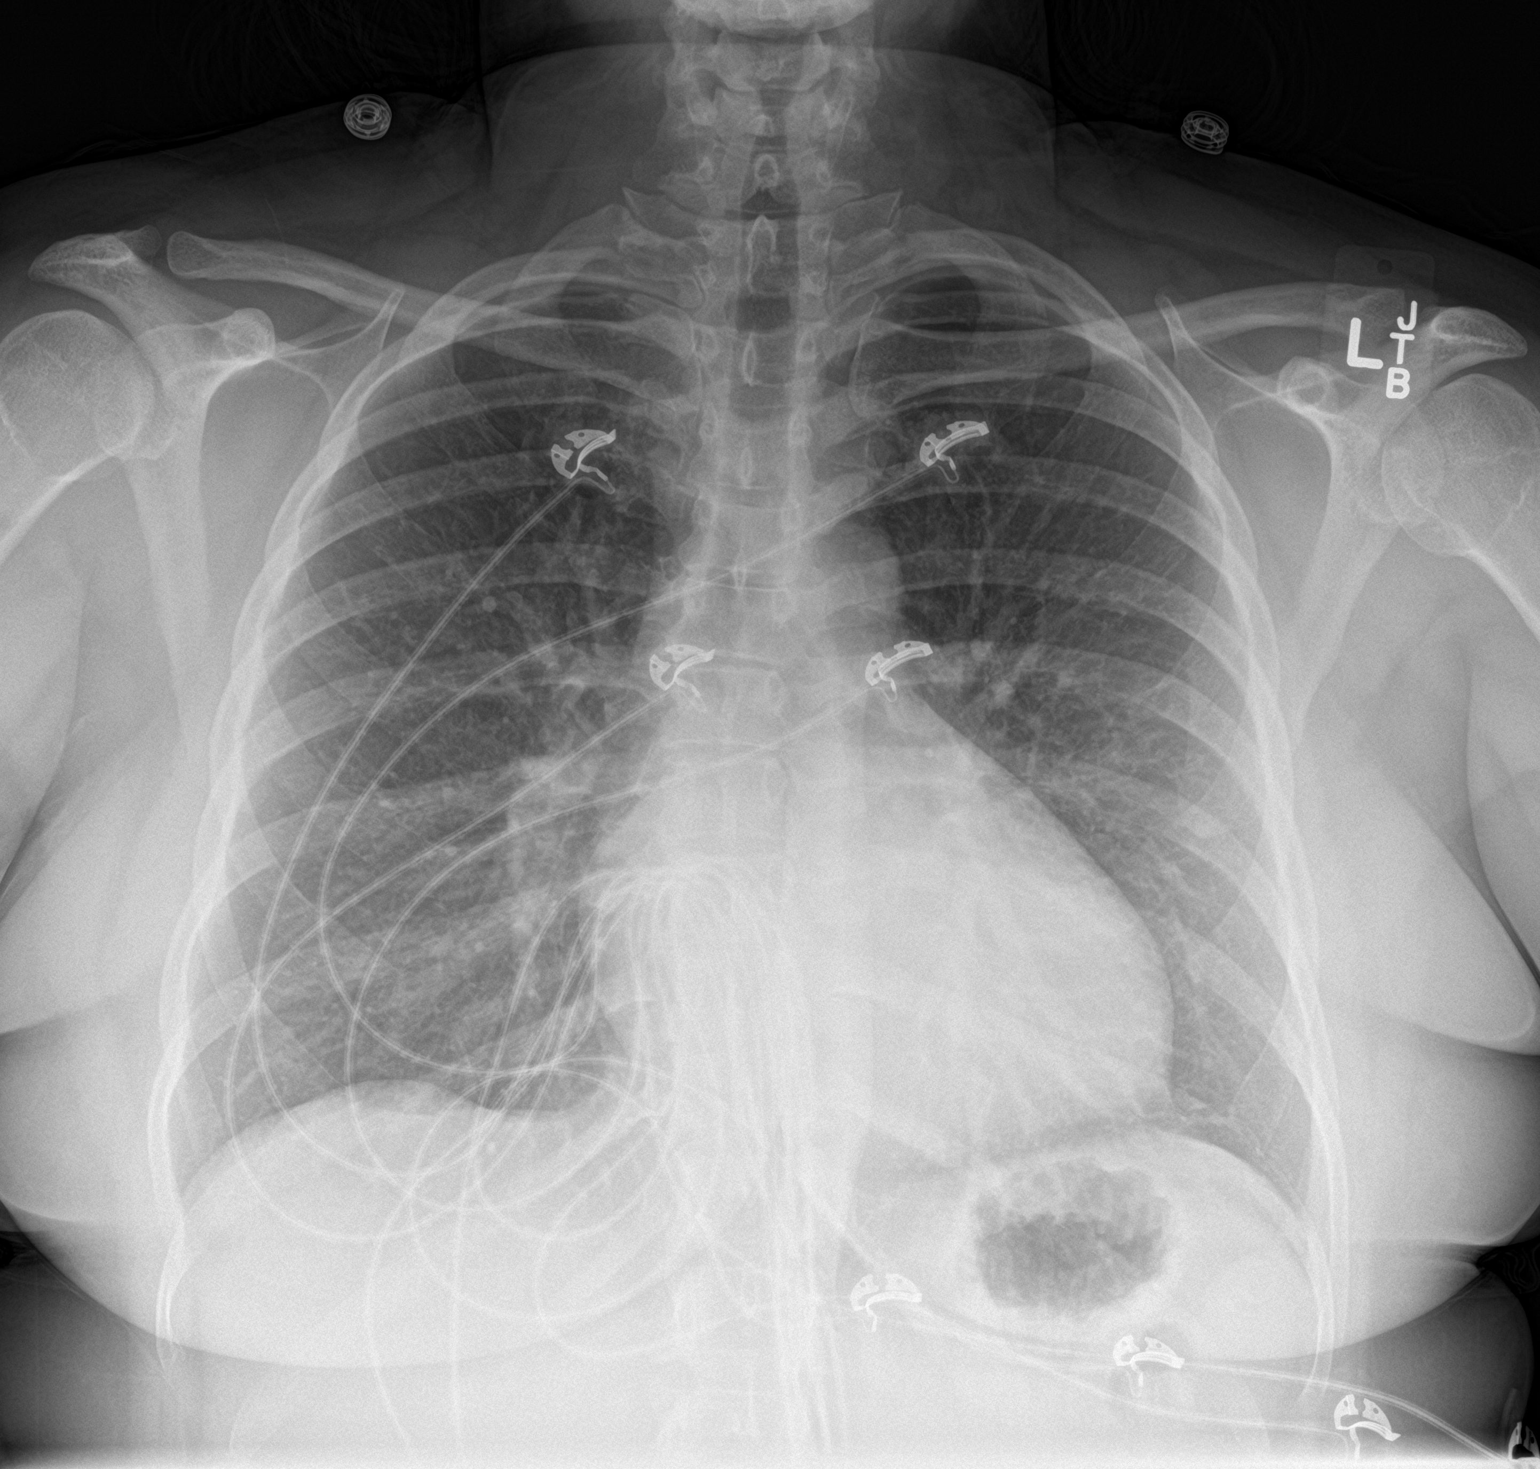

[chest lat]
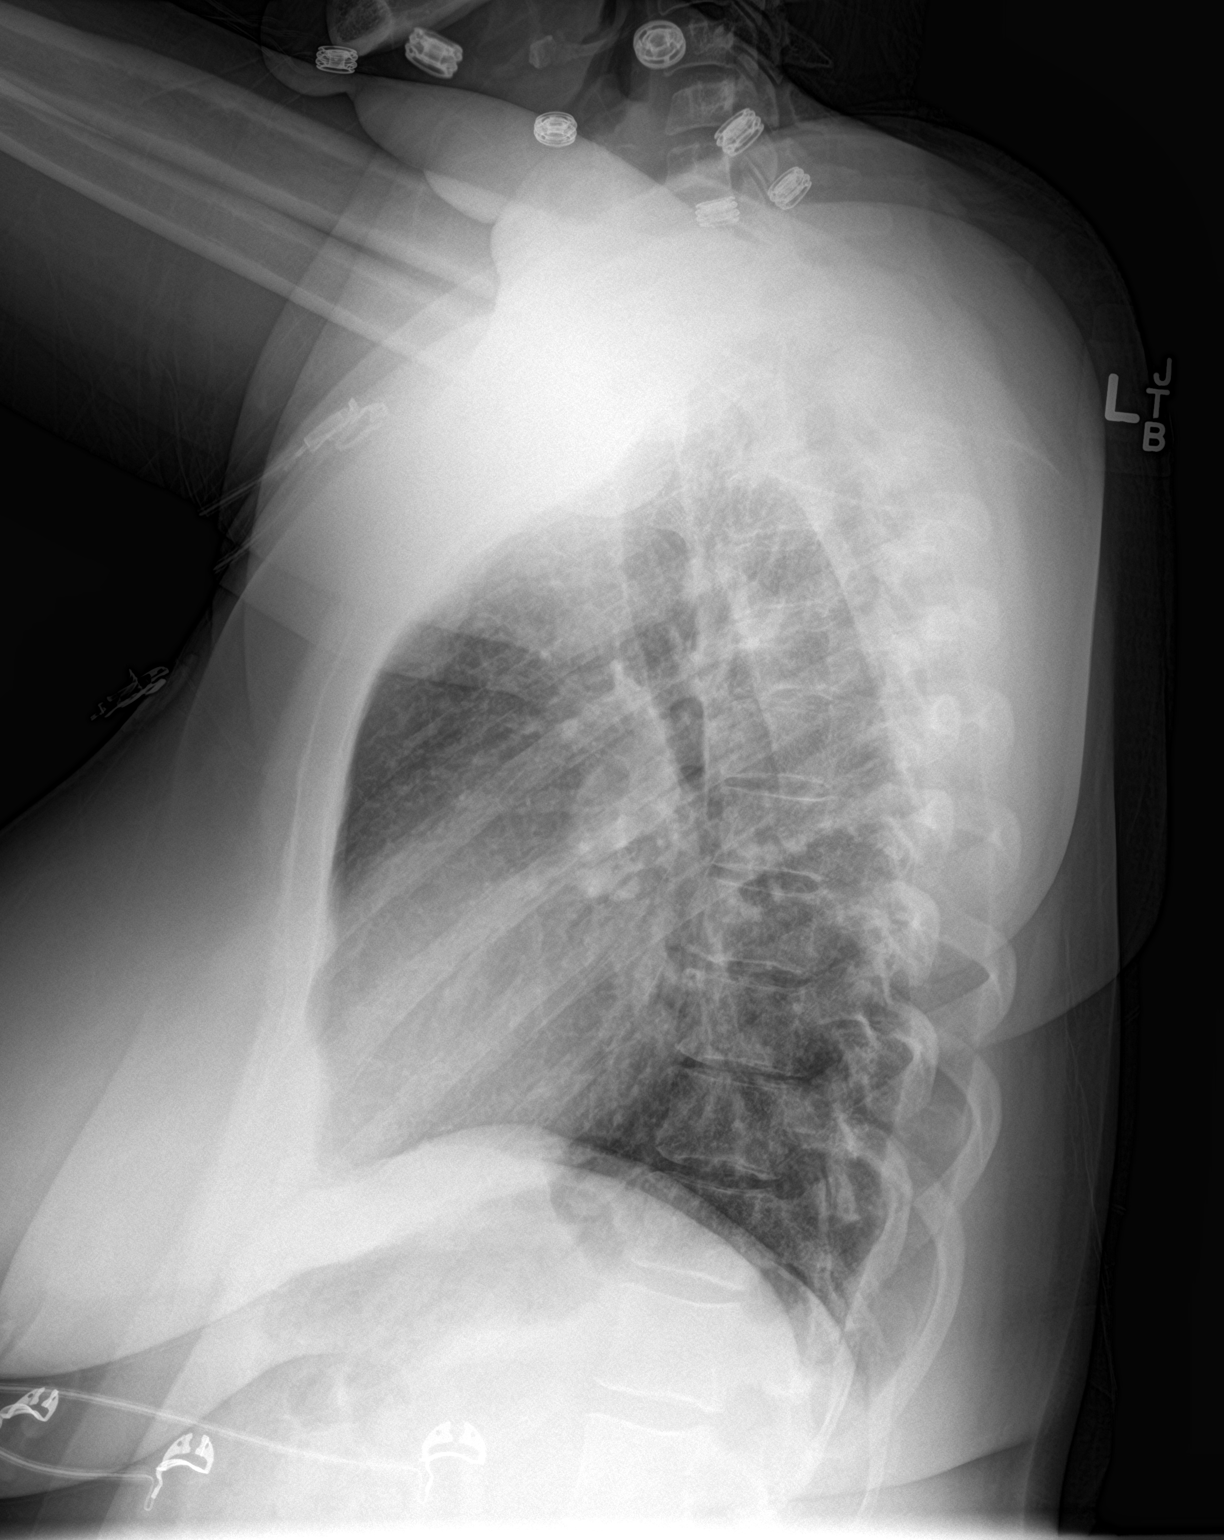

[2 of 2 positions shown; findings below may reference images not displayed]

FINDINGS: The heart size and mediastinal contours are within normal limits.
Both lungs are clear. The visualized skeletal structures are
unremarkable.
IMPRESSION: No active cardiopulmonary disease.

## 2018-08-30 MED ORDER — HYDROXYZINE HCL 25 MG PO TABS: 25.0000 mg | ORAL_TABLET | Freq: Four times a day (QID) | ORAL | 0 refills | Status: DC

## 2018-08-30 MED ORDER — HYDROXYZINE HCL 10 MG PO TABS
10.0000 mg | ORAL_TABLET | Freq: Once | ORAL | Status: AC
  Administered 2018-08-30: 10 mg via ORAL
  Filled 2018-08-30: qty 1
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
## 2018-08-30 NOTE — Discharge Instructions (Addendum)
You were seen in the emergency department today for chest pain. Your work-up in the emergency department has been overall reassuring. Your labs have been fairly normal and or similar to previous blood work you have had done. Your EKG and the enzyme we use to check your heart did not show an acute heart attack at this time. Your chest x-ray was normal.   We would like you to follow up closely with your primary care provider within 1-3 days. Return to the ER immediately should you experience any new or worsening symptoms including but not limited to return of pain, worsened pain, vomiting, shortness of breath, dizziness, lightheadedness, passing out, or any other concerns that you may have.   Regarding stress/anxiety. Please take atarax every 6 hours as needed for anxiety. We have prescribed you new medication(s) today. Discuss the medications prescribed today with your pharmacist as they can have adverse effects and interactions with your other medicines including over the counter and prescribed medications. Seek medical evaluation if you start to experience new or abnormal symptoms after taking one of these medicines, seek care immediately if you start to experience difficulty breathing, feeling of your throat closing, facial swelling, or rash as these could be indications of a more serious allergic reaction  Please follow-up with the resources provided to you by our behavioral health team.  Please follow-up within the next 3 days. Return to the ER for new or worsening symptoms including but not limited to thoughts of hurting yourself, thoughts of hurting others, hallucinations, or any other concerns.

## 2018-08-30 NOTE — ED Triage Notes (Signed)
Pt reports having anxiety and stress that has caused chest pain and SOB intermittent for a month. Pt tearful.
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
## 2018-08-30 NOTE — ED Notes (Signed)
Pt verbalized understanding of discharge paperwork, prescriptions and follow-up care. E-sig pad not working for pt signature.

## 2018-08-30 NOTE — ED Notes (Signed)
Patient transported to X-ray 

## 2018-08-30 NOTE — BH Assessment (Signed)
This clinician called to complete assessment. Pt is in xray.

## 2018-10-03 ENCOUNTER — Emergency Department (HOSPITAL_COMMUNITY): Payer: Self-pay

## 2018-10-03 ENCOUNTER — Encounter (HOSPITAL_COMMUNITY): Payer: Self-pay

## 2018-10-03 ENCOUNTER — Emergency Department (HOSPITAL_COMMUNITY)
Admission: EM | Admit: 2018-10-03 | Discharge: 2018-10-03 | Disposition: A | Discharge status: Home / Self Care | Payer: Self-pay | Attending: Emergency Medicine | Admitting: Emergency Medicine

## 2018-10-03 ENCOUNTER — Other Ambulatory Visit: Payer: Self-pay

## 2018-10-03 DIAGNOSIS — Z87891 Personal history of nicotine dependence: Secondary | ICD-10-CM | POA: Insufficient documentation

## 2018-10-03 DIAGNOSIS — Z79899 Other long term (current) drug therapy: Secondary | ICD-10-CM | POA: Insufficient documentation

## 2018-10-03 DIAGNOSIS — R1031 Right lower quadrant pain: Secondary | ICD-10-CM | POA: Insufficient documentation

## 2018-10-03 DIAGNOSIS — N898 Other specified noninflammatory disorders of vagina: Secondary | ICD-10-CM | POA: Insufficient documentation

## 2018-10-03 LAB — URINALYSIS, ROUTINE W REFLEX MICROSCOPIC
Bilirubin Urine: NEGATIVE
Glucose, UA: NEGATIVE mg/dL
Ketones, ur: NEGATIVE mg/dL
Leukocytes,Ua: NEGATIVE
Nitrite: NEGATIVE
Protein, ur: NEGATIVE mg/dL
Specific Gravity, Urine: 1.002 — ABNORMAL LOW (ref 1.005–1.030)
pH: 7 (ref 5.0–8.0)

## 2018-10-03 LAB — COMPREHENSIVE METABOLIC PANEL
ALT: 22 U/L (ref 0–44)
AST: 19 U/L (ref 15–41)
Albumin: 3.9 g/dL (ref 3.5–5.0)
Alkaline Phosphatase: 74 U/L (ref 38–126)
Anion gap: 8 (ref 5–15)
BUN: 13 mg/dL (ref 6–20)
CO2: 24 mmol/L (ref 22–32)
Calcium: 8.8 mg/dL — ABNORMAL LOW (ref 8.9–10.3)
Chloride: 104 mmol/L (ref 98–111)
Creatinine, Ser: 0.74 mg/dL (ref 0.44–1.00)
GFR calc Af Amer: >60 mL/min (ref >60)
GFR calc non Af Amer: >60 mL/min (ref >60)
Glucose, Bld: 116 mg/dL — ABNORMAL HIGH (ref 70–99)
Potassium: 3.9 mmol/L (ref 3.5–5.1)
Sodium: 136 mmol/L (ref 135–145)
Total Bilirubin: 0.4 mg/dL (ref 0.3–1.2)
Total Protein: 6.7 g/dL (ref 6.5–8.1)

## 2018-10-03 LAB — CBC
HCT: 40 % (ref 36.0–46.0)
Hemoglobin: 13.2 g/dL (ref 12.0–15.0)
MCH: 29.5 pg (ref 26.0–34.0)
MCHC: 33 g/dL (ref 30.0–36.0)
MCV: 89.5 fL (ref 80.0–100.0)
Platelets: 281 10*3/uL (ref 150–400)
RBC: 4.47 MIL/uL (ref 3.87–5.11)
RDW: 12.5 % (ref 11.5–15.5)
WBC: 10.8 10*3/uL — ABNORMAL HIGH (ref 4.0–10.5)
nRBC: 0 % (ref 0.0–0.2)

## 2018-10-03 LAB — WET PREP, GENITAL
Clue Cells Wet Prep HPF POC: NONE SEEN
Sperm: NONE SEEN
Trich, Wet Prep: NONE SEEN
Yeast Wet Prep HPF POC: NONE SEEN

## 2018-10-03 LAB — I-STAT BETA HCG BLOOD, ED (MC, WL, AP ONLY): I-stat hCG, quantitative: <5 m[IU]/mL (ref <5)

## 2018-10-03 LAB — LIPASE, BLOOD: Lipase: 27 U/L (ref 11–51)

## 2018-10-03 IMAGING — CT CT RENAL STONE PROTOCOL
2 of 4 series · 17 of 46 positions shown, 19 images · non-contrast
Comparison: Radiograph [DATE]

CLINICAL DATA: Flank pain

EXAM:
CT ABDOMEN AND PELVIS WITHOUT CONTRAST
TECHNIQUE: Multidetector CT imaging of the abdomen and pelvis was performed
following the standard protocol without IV contrast.

[Series 3: renal stone 5.0 · axial · 0.95mm/px · z∈[+974,+1429]mm · 14 of 101 slices shown, 16 images]
[im 5/101  soft-tissue]
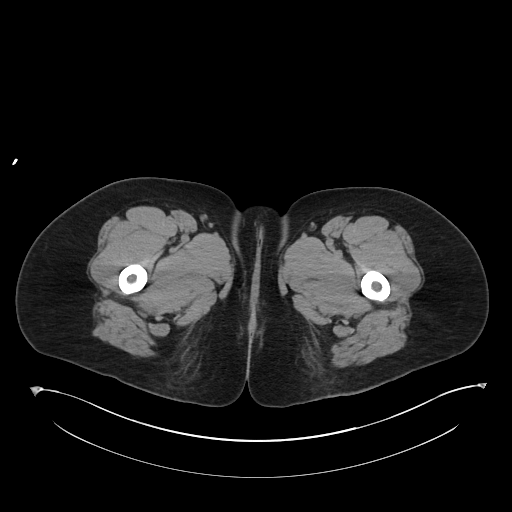
[im 5/101  bone]
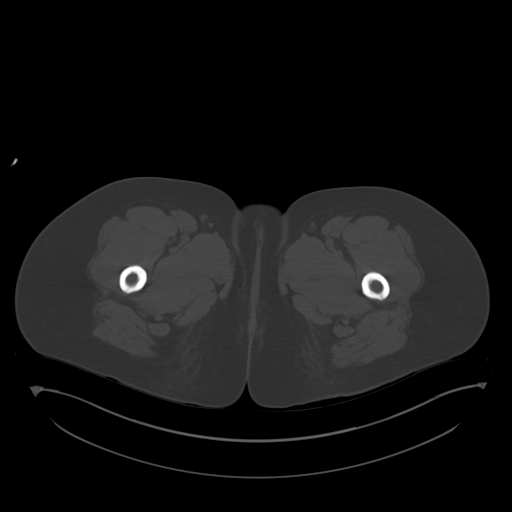
[im 14/101  soft-tissue]
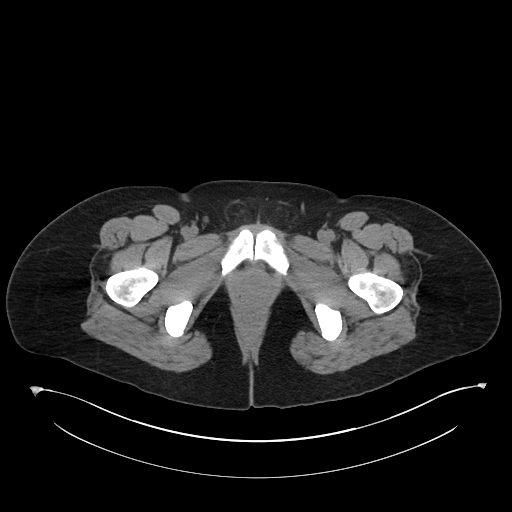
[im 19/101  soft-tissue]
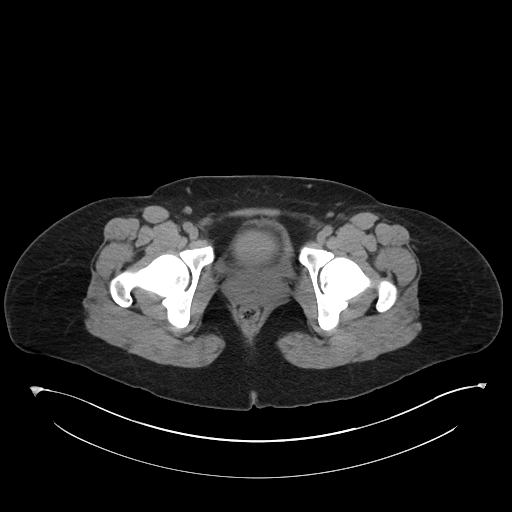
[im 28/101  soft-tissue]
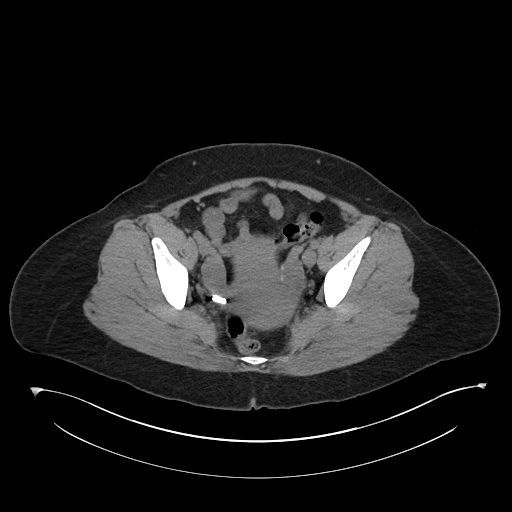
[im 32/101  soft-tissue]
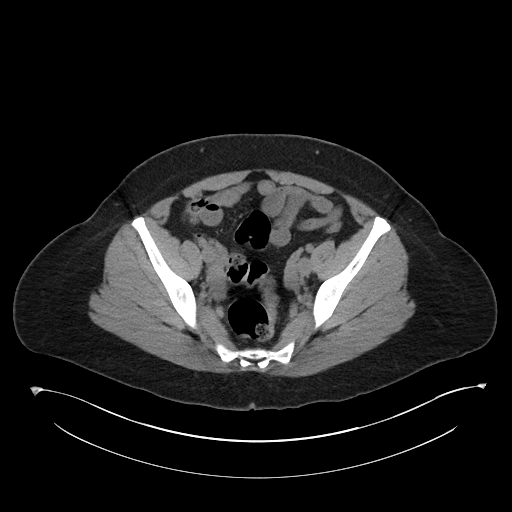
[im 41/101  soft-tissue]
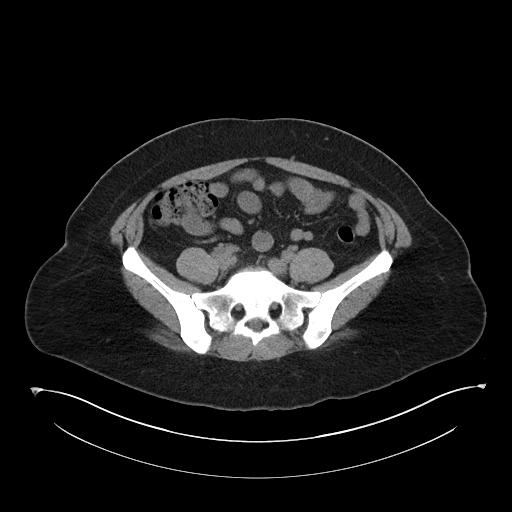
[im 46/101  soft-tissue]
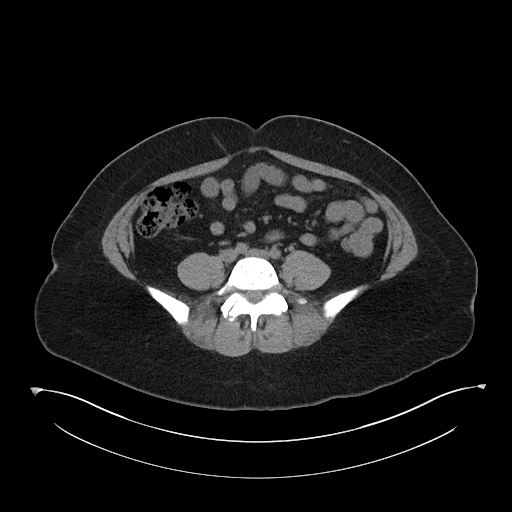
[im 55/101  soft-tissue]
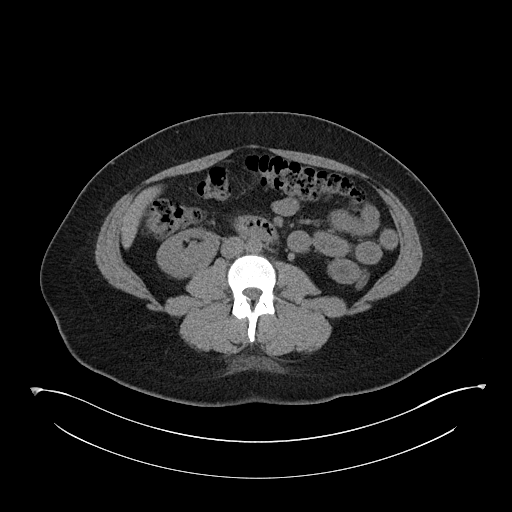
[im 60/101  soft-tissue]
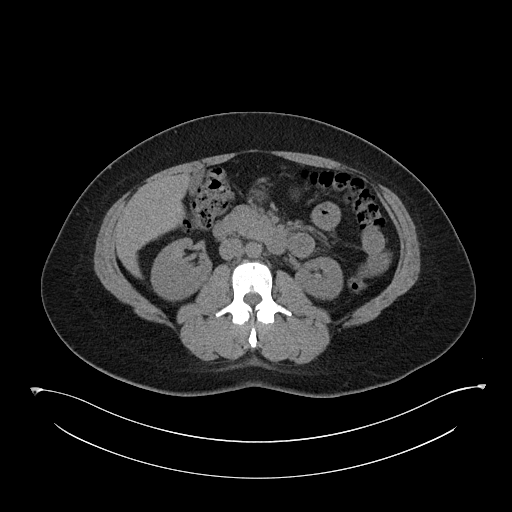
[im 60/101  bone]
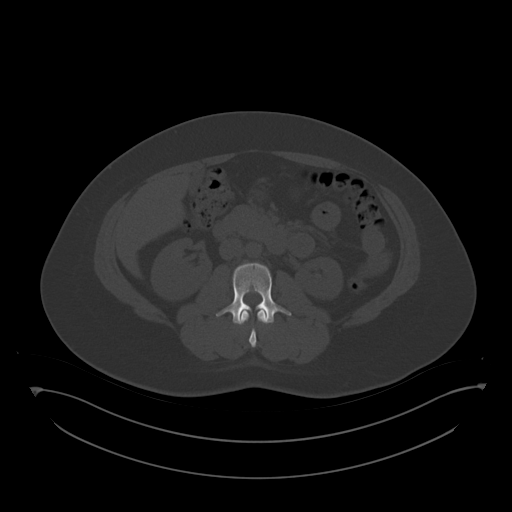
[im 69/101  soft-tissue]
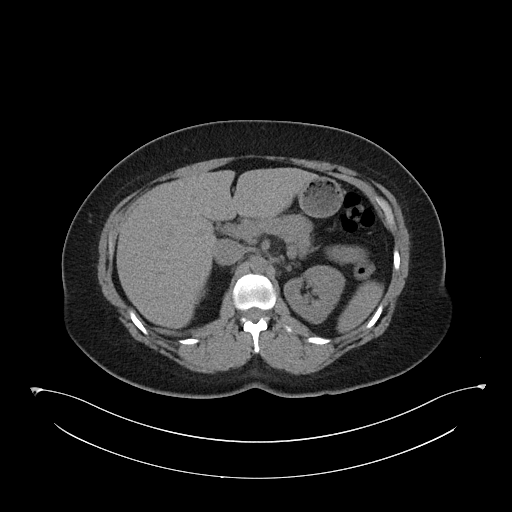
[im 73/101  soft-tissue]
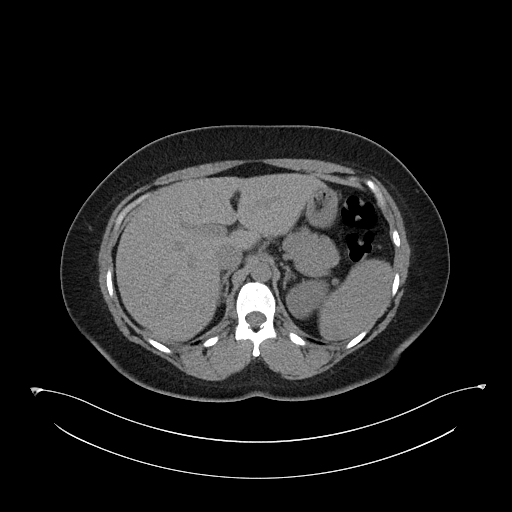
[im 82/101  soft-tissue]
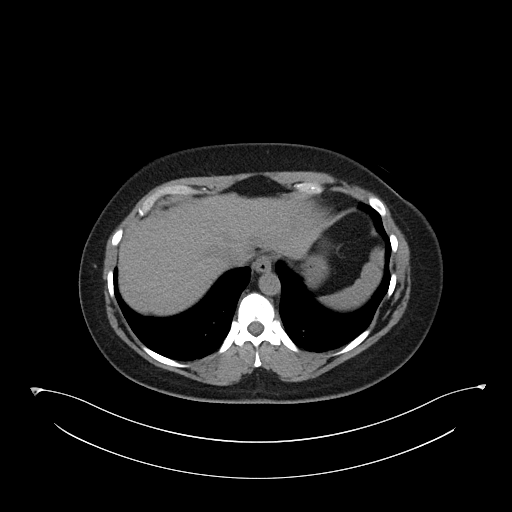
[im 87/101  soft-tissue]
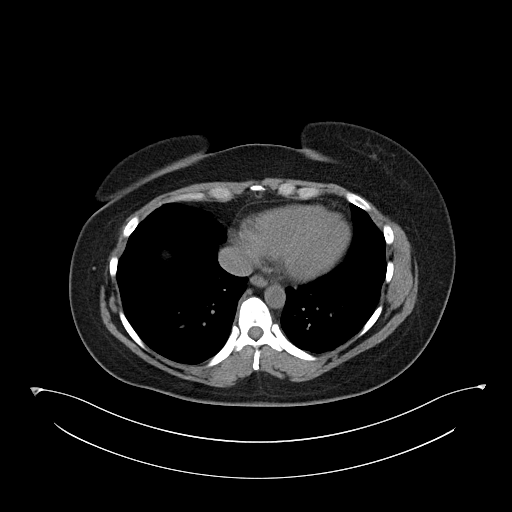
[im 96/101  soft-tissue]
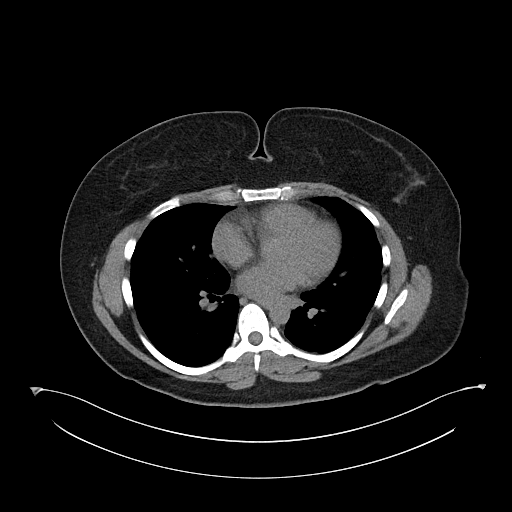

[Series 5: renal stone 3.0 cor · coronal · 0.95mm/px · 3 of 108 slices shown]
[im 36/108  soft-tissue]
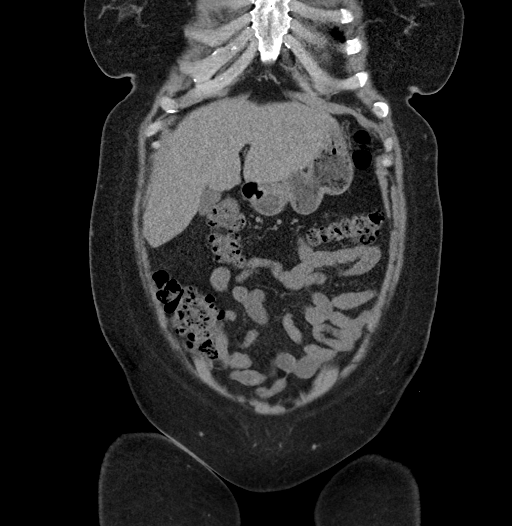
[im 48/108  soft-tissue]
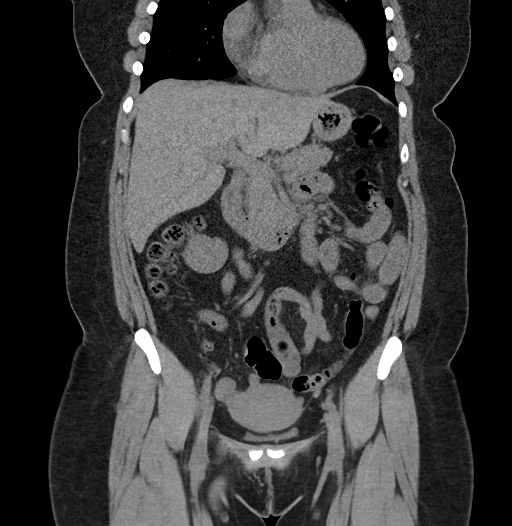
[im 60/108  soft-tissue]
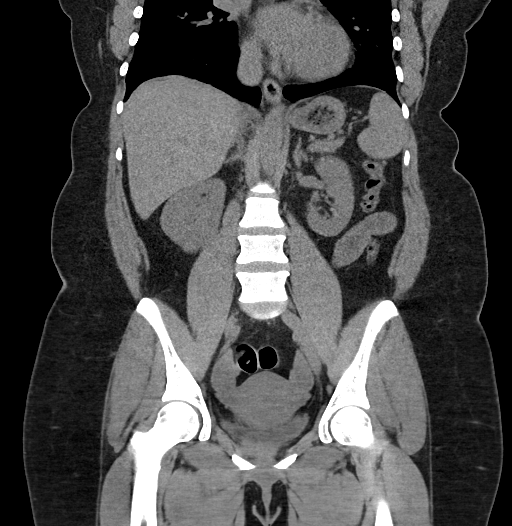

[17 of 46 positions shown; findings below may reference images not displayed]

FINDINGS: Lower chest: Lung bases demonstrate no acute consolidation or
effusion. Normal heart size.

Hepatobiliary: No focal liver abnormality is seen. No gallstones,
gallbladder wall thickening, or biliary dilatation.

Pancreas: Unremarkable. No pancreatic ductal dilatation or
surrounding inflammatory changes.

Spleen: Normal in size without focal abnormality.

Adrenals/Urinary Tract: Adrenal glands are unremarkable. Kidneys are
normal, without renal calculi, focal lesion, or hydronephrosis.
Bladder is unremarkable.

Stomach/Bowel: Stomach is within normal limits. Appendix appears
normal. No evidence of bowel wall thickening, distention, or
inflammatory changes.

Vascular/Lymphatic: No significant vascular findings are present. No
enlarged abdominal or pelvic lymph nodes.

Reproductive: Uterus unremarkable except for small foci of gas in
the vagina. Right tubal ligation clip. Displaced/superiorly migrated
left ligation clip, visible within the left mid abdomen anteriorly.
No adnexal mass

Other: No abdominal wall hernia or abnormality. No abdominopelvic
ascites.

Musculoskeletal: No acute or significant osseous findings.
IMPRESSION: 1. Negative for hydronephrosis or ureteral stone. Negative for
appendicitis
2. Tubal ligation clips, appears appropriate in position on the
right. The left ligation clip is displaced and has migrated to the
left mid abdomen anteriorly.

## 2018-10-03 MED ORDER — MORPHINE SULFATE (PF) 4 MG/ML IV SOLN
4.0000 mg | Freq: Once | INTRAVENOUS | Status: AC
  Administered 2018-10-03: 21:00:00 4 mg via INTRAVENOUS
  Filled 2018-10-03: qty 1

## 2018-10-03 MED ORDER — ONDANSETRON HCL 4 MG/2ML IJ SOLN
4.0000 mg | Freq: Once | INTRAMUSCULAR | Status: AC
  Administered 2018-10-03: 4 mg via INTRAVENOUS
  Filled 2018-10-03: qty 2

## 2018-10-03 MED ORDER — SODIUM CHLORIDE 0.9 % IV BOLUS
1000.0000 mL | Freq: Once | INTRAVENOUS | Status: AC
  Administered 2018-10-03: 1000 mL via INTRAVENOUS

## 2018-10-03 MED ORDER — SODIUM CHLORIDE 0.9% FLUSH: 3.0000 mL | Freq: Once | INTRAVENOUS | Status: DC

## 2018-10-03 NOTE — ED Notes (Signed)
Patient verbalizes understanding of discharge instructions. Opportunity for questioning and answers were provided. Armband removed by staff, pt discharged from ED. Follow up care reviewed.

## 2018-10-03 NOTE — ED Notes (Signed)
Patient transported to CT 
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
## 2018-10-03 NOTE — Discharge Instructions (Signed)
Your work-up overall is reassuring, your CT scan looks good and labs are normal as well.  You may have passed a small kidney stone today causing your pain this could also be pain in the abdominal wall muscles.  Please monitor your symptoms closely if you have any new or worsening pain, fevers, vomiting or any other new or concerning symptoms please return to the ED for reevaluation.

## 2018-10-03 NOTE — ED Triage Notes (Signed)
Pt reports RLQ abdominal pain and urinary frequency. She reports hx of UTI but this doesn't feel the same. No distress noted. Afebrile.

## 2018-10-04 LAB — GC/CHLAMYDIA PROBE AMP (~~LOC~~) NOT AT ARMC
Chlamydia: NEGATIVE
Neisseria Gonorrhea: NEGATIVE

## 2018-10-04 LAB — HIV ANTIBODY (ROUTINE TESTING W REFLEX): HIV Screen 4th Generation wRfx: NONREACTIVE

## 2018-10-04 LAB — RPR: RPR Ser Ql: NONREACTIVE

## 2019-03-06 DIAGNOSIS — M722 Plantar fascial fibromatosis: Secondary | ICD-10-CM | POA: Diagnosis not present

## 2019-03-06 DIAGNOSIS — M7752 Other enthesopathy of left foot: Secondary | ICD-10-CM | POA: Diagnosis not present

## 2019-03-06 DIAGNOSIS — S93492A Sprain of other ligament of left ankle, initial encounter: Secondary | ICD-10-CM | POA: Diagnosis not present

## 2019-03-06 DIAGNOSIS — M7731 Calcaneal spur, right foot: Secondary | ICD-10-CM | POA: Diagnosis not present

## 2019-03-06 DIAGNOSIS — M71571 Other bursitis, not elsewhere classified, right ankle and foot: Secondary | ICD-10-CM | POA: Diagnosis not present

## 2019-03-06 DIAGNOSIS — S93409A Sprain of unspecified ligament of unspecified ankle, initial encounter: Secondary | ICD-10-CM | POA: Diagnosis not present

## 2019-03-06 DIAGNOSIS — M67372 Transient synovitis, left ankle and foot: Secondary | ICD-10-CM | POA: Diagnosis not present

## 2019-03-06 DIAGNOSIS — M19072 Primary osteoarthritis, left ankle and foot: Secondary | ICD-10-CM | POA: Diagnosis not present

## 2019-03-07 DIAGNOSIS — Z23 Encounter for immunization: Secondary | ICD-10-CM | POA: Diagnosis not present

## 2019-03-10 DIAGNOSIS — M722 Plantar fascial fibromatosis: Secondary | ICD-10-CM | POA: Diagnosis not present

## 2019-03-10 DIAGNOSIS — M71571 Other bursitis, not elsewhere classified, right ankle and foot: Secondary | ICD-10-CM | POA: Diagnosis not present

## 2019-03-10 DIAGNOSIS — M67372 Transient synovitis, left ankle and foot: Secondary | ICD-10-CM | POA: Diagnosis not present

## 2019-03-10 DIAGNOSIS — M7752 Other enthesopathy of left foot: Secondary | ICD-10-CM | POA: Diagnosis not present

## 2019-05-20 ENCOUNTER — Encounter: Payer: Self-pay | Admitting: Family Medicine

## 2019-05-20 ENCOUNTER — Other Ambulatory Visit: Payer: Self-pay

## 2019-05-20 ENCOUNTER — Ambulatory Visit: Payer: BC Managed Care – PPO | Attending: Family Medicine | Admitting: Family Medicine

## 2019-05-20 DIAGNOSIS — F32A Depression, unspecified: Secondary | ICD-10-CM

## 2019-05-20 DIAGNOSIS — F329 Major depressive disorder, single episode, unspecified: Secondary | ICD-10-CM

## 2019-05-20 DIAGNOSIS — N83209 Unspecified ovarian cyst, unspecified side: Secondary | ICD-10-CM | POA: Diagnosis not present

## 2019-05-20 DIAGNOSIS — F419 Anxiety disorder, unspecified: Secondary | ICD-10-CM

## 2019-05-20 DIAGNOSIS — R29818 Other symptoms and signs involving the nervous system: Secondary | ICD-10-CM

## 2019-05-20 NOTE — Progress Notes (Signed)
Patient has been called and DOB has been verified. Patient has been screened and transferred to PCP to start phone visit.  Patient is requesting some lab work to be done.
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
## 2019-05-27 ENCOUNTER — Telehealth: Payer: Self-pay | Admitting: General Practice

## 2019-05-27 NOTE — Telephone Encounter (Signed)
Terry from Sleep center stated [employer] can approve a In home sleep test. Please change order reach out to [place] at [phone].

## 2019-07-04 ENCOUNTER — Telehealth: Payer: Self-pay | Admitting: General Practice

## 2019-07-04 DIAGNOSIS — R29818 Other symptoms and signs involving the nervous system: Secondary | ICD-10-CM

## 2019-07-04 NOTE — Telephone Encounter (Signed)
Patient called and requested for an update regarding her sleep study. Please follow up at your earliest convenience.

## 2019-07-11 NOTE — Telephone Encounter (Signed)
Order order has been placed

## 2019-07-11 NOTE — Telephone Encounter (Signed)
Patient is needing an order for at home sleep study due to insurance.

## 2019-07-11 NOTE — Telephone Encounter (Signed)
Patient called and requested to speak with someone regarding her sleep study. Patient stated that she contacted [facility] regarding her sleep study and they informed her that with the insurance she has it would have to be a at home sleep study. Please follow up at your earliest convenience. Patient stated that she is off Wednesday and Thursday so if someone plans to schedule for her one of those days would be best.

## 2019-07-11 NOTE — Telephone Encounter (Signed)
Patient was called and informed of order being placed for home sleep study.

## 2019-08-12 DIAGNOSIS — N760 Acute vaginitis: Secondary | ICD-10-CM | POA: Diagnosis not present

## 2019-09-11 ENCOUNTER — Other Ambulatory Visit: Payer: Self-pay

## 2019-09-11 ENCOUNTER — Ambulatory Visit (HOSPITAL_BASED_OUTPATIENT_CLINIC_OR_DEPARTMENT_OTHER): Payer: BC Managed Care – PPO

## 2019-10-14 ENCOUNTER — Other Ambulatory Visit: Payer: Self-pay

## 2019-10-14 ENCOUNTER — Ambulatory Visit (HOSPITAL_BASED_OUTPATIENT_CLINIC_OR_DEPARTMENT_OTHER): Payer: BC Managed Care – PPO | Attending: Family Medicine | Admitting: Internal Medicine

## 2019-10-14 VITALS — Ht 62.0 in | Wt 179.0 lb

## 2019-10-14 DIAGNOSIS — R0683 Snoring: Secondary | ICD-10-CM | POA: Insufficient documentation

## 2019-10-14 DIAGNOSIS — R29818 Other symptoms and signs involving the nervous system: Secondary | ICD-10-CM

## 2019-10-23 ENCOUNTER — Ambulatory Visit: Payer: BC Managed Care – PPO | Attending: Internal Medicine

## 2019-10-23 DIAGNOSIS — Z23 Encounter for immunization: Secondary | ICD-10-CM

## 2019-10-23 NOTE — Progress Notes (Signed)
   Covid-19 Vaccination Clinic  Name:  [name] [name]    MRN: [id number] DOB: [birth year]  10/23/2019  [name] was observed post Covid-19 immunization for 15 minutes without incident. She was provided with Vaccine Information Sheet and instruction to access the V-Safe system.   [name] was instructed to call 911 with any severe reactions post vaccine: Marland Kitchen Difficulty breathing  . Swelling of face and throat  . A fast heartbeat  . A bad rash all over body  . Dizziness and weakness   Immunizations Administered    Name Date Dose VIS Date Route   Pfizer COVID-19 Vaccine 10/23/2019  9:29 AM 0.3 mL 07/02/2018 Intramuscular   Manufacturer: [employer], [employer]   Lot: [id number]   [place]: [id number]

## 2019-10-25 DIAGNOSIS — R29818 Other symptoms and signs involving the nervous system: Secondary | ICD-10-CM | POA: Diagnosis not present

## 2019-10-25 NOTE — Procedures (Signed)
    Patient Name: [name] [name], [name] [name] Date: 10/14/2019 Gender: Female D.O.B: [birth year] Age (years): 38 Referring Provider: Arnoldo Morale Height (inches): 62 Interpreting Physician: Baird Lyons MD, ABSM Weight (lbs): 179 RPSGT: Jacolyn Reedy BMI: 33 MRN: [id number] Neck Size: 14.00  CLINICAL INFORMATION Sleep Study Type: HST Indication for sleep study: Snoring Epworth Sleepiness Score: 14  Most recent polysomnogram was on 09/11/2019.  SLEEP STUDY TECHNIQUE A multi-channel overnight portable sleep study was performed. The channels recorded were: nasal airflow, thoracic respiratory movement, and oxygen saturation with a pulse oximetry. Snoring was also monitored.  MEDICATIONS Patient self administered medications include: none reported.  SLEEP ARCHITECTURE Patient was studied for 428.4 minutes. The sleep efficiency was 99.9 % and the patient was supine for 88.8%. The arousal index was 0.0 per hour.  RESPIRATORY PARAMETERS The overall AHI was 1.8 per hour, with a central apnea index of 0.0 per hour. The oxygen nadir was 90% during sleep.  CARDIAC DATA Mean heart rate during sleep was 67.3 bpm.  IMPRESSIONS - No significant obstructive sleep apnea occurred during this study (AHI = 1.8/h). - No significant central sleep apnea occurred during this study (CAI = 0.0/h). - The patient had minimal or no oxygen desaturation during the study (Min O2 = 90%) - Patient snored.Marland Kitchen  DIAGNOSIS - Normal study - Primary snoring  RECOMMENDATIONS - Be careful with alcohol, sedatives and other CNS depressants that may worsen sleep apnea and disrupt normal sleep architecture. - Sleep hygiene should be reviewed to assess factors that may improve sleep quality. - Weight management and regular exercise should be initiated or continued. - Patient may benefit from in-lab study if these results are not consistent with clinical impression.  [Electronically signed] 10/25/2019 10:41  AM  Baird Lyons MD, ABSM Diplomate, American Board of Sleep Medicine   NPI: [id number]                         [place] [place] [facility]  ELECTRONICALLY SIGNED ON:  10/25/2019, 10:39 AM [place] PH: [phone]   FX: [phone] [place]

## 2019-10-27 DIAGNOSIS — J00 Acute nasopharyngitis [common cold]: Secondary | ICD-10-CM | POA: Diagnosis not present

## 2019-10-28 ENCOUNTER — Telehealth: Payer: Self-pay

## 2019-10-28 NOTE — Telephone Encounter (Signed)
-----   Message from Charlott Rakes, MD sent at 10/27/2019  8:10 AM EDT ----- Sleep study was normal and negative for obstructive sleep apnea.

## 2019-10-28 NOTE — Telephone Encounter (Signed)
Patient name and DOB has been verified Patient was informed of lab results. Patient had no questions.  

## 2019-10-28 NOTE — Telephone Encounter (Signed)
Patient was called and a voicemail was left informing patient to return phone call for lab results. 

## 2019-11-13 ENCOUNTER — Ambulatory Visit: Payer: BC Managed Care – PPO | Attending: Internal Medicine

## 2019-11-13 DIAGNOSIS — Z23 Encounter for immunization: Secondary | ICD-10-CM

## 2019-11-13 NOTE — Progress Notes (Signed)
   Covid-19 Vaccination Clinic  Name:  [name] [name]    MRN: [id number] DOB: 12-31-1980  11/13/2019  [name] was observed post Covid-19 immunization for 15 minutes without incident. She was provided with Vaccine Information Sheet and instruction to access the V-Safe system.   [name] was instructed to call 911 with any severe reactions post vaccine: Marland Kitchen Difficulty breathing  . Swelling of face and throat  . A fast heartbeat  . A bad rash all over body  . Dizziness and weakness   Immunizations Administered    Name Date Dose VIS Date Route   Pfizer COVID-19 Vaccine 11/13/2019  8:59 AM 0.3 mL 07/02/2018 Intramuscular   Manufacturer: [employer], [employer]   Lot: [id number]   [place]: [id number]

## 2019-11-24 ENCOUNTER — Encounter: Payer: Self-pay | Admitting: Family Medicine

## 2019-11-24 ENCOUNTER — Ambulatory Visit (HOSPITAL_BASED_OUTPATIENT_CLINIC_OR_DEPARTMENT_OTHER): Payer: BC Managed Care – PPO | Admitting: Family Medicine

## 2019-11-24 ENCOUNTER — Other Ambulatory Visit: Payer: Self-pay

## 2019-11-24 ENCOUNTER — Other Ambulatory Visit (HOSPITAL_COMMUNITY)
Admission: RE | Admit: 2019-11-24 | Discharge: 2019-11-24 | Disposition: A | Discharge status: Home / Self Care | Payer: BC Managed Care – PPO | Source: Ambulatory Visit | Attending: Family Medicine | Admitting: Family Medicine

## 2019-11-24 VITALS — BP 93/62 | HR 74 | Ht 62.0 in | Wt 186.0 lb

## 2019-11-24 DIAGNOSIS — Z113 Encounter for screening for infections with a predominantly sexual mode of transmission: Secondary | ICD-10-CM | POA: Diagnosis not present

## 2019-11-24 DIAGNOSIS — R5383 Other fatigue: Secondary | ICD-10-CM

## 2019-11-24 DIAGNOSIS — Z1159 Encounter for screening for other viral diseases: Secondary | ICD-10-CM

## 2019-11-24 DIAGNOSIS — Z124 Encounter for screening for malignant neoplasm of cervix: Secondary | ICD-10-CM | POA: Diagnosis not present

## 2019-11-24 DIAGNOSIS — Z13228 Encounter for screening for other metabolic disorders: Secondary | ICD-10-CM

## 2019-11-24 DIAGNOSIS — Z0000 Encounter for general adult medical examination without abnormal findings: Secondary | ICD-10-CM
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
## 2019-11-24 NOTE — Progress Notes (Signed)
States that she has had recurring yeast infections.  Has been fatigue.

## 2019-11-24 NOTE — Addendum Note (Signed)
Addended by: Charlott Rakes on: 11/24/2019 12:56 PM   Modules accepted: Level of Service

## 2019-11-24 NOTE — Patient Instructions (Signed)
Health Maintenance, Female Adopting a healthy lifestyle and getting preventive care are important in promoting health and wellness. Ask your health care provider about:  The right schedule for you to have regular tests and exams.  Things you can do on your own to prevent diseases and keep yourself healthy. What should I know about diet, weight, and exercise? Eat a healthy diet   Eat a diet that includes plenty of vegetables, fruits, low-fat dairy products, and lean protein.  Do not eat a lot of foods that are high in solid fats, added sugars, or sodium. Maintain a healthy weight Body mass index (BMI) is used to identify weight problems. It estimates body fat based on height and weight. Your health care provider can help determine your BMI and help you achieve or maintain a healthy weight. Get regular exercise Get regular exercise. This is one of the most important things you can do for your health. Most adults should:  Exercise for at least 150 minutes each week. The exercise should increase your heart rate and make you sweat (moderate-intensity exercise).  Do strengthening exercises at least twice a week. This is in addition to the moderate-intensity exercise.  Spend less time sitting. Even light physical activity can be beneficial. Watch cholesterol and blood lipids Have your blood tested for lipids and cholesterol at 39 years of age, then have this test every 5 years. Have your cholesterol levels checked more often if:  Your lipid or cholesterol levels are high.  You are older than 40 years of age.  You are at high risk for heart disease. What should I know about cancer screening? Depending on your health history and family history, you may need to have cancer screening at various ages. This may include screening for:  Breast cancer.  Cervical cancer.  Colorectal cancer.  Skin cancer.  Lung cancer. What should I know about heart disease, diabetes, and high blood  pressure? Blood pressure and heart disease  High blood pressure causes heart disease and increases the risk of stroke. This is more likely to develop in people who have high blood pressure readings, are of African descent, or are overweight.  Have your blood pressure checked: ? Every 3-5 years if you are 18-39 years of age. ? Every year if you are 40 years old or older. Diabetes Have regular diabetes screenings. This checks your fasting blood sugar level. Have the screening done:  Once every three years after age 40 if you are at a normal weight and have a low risk for diabetes.  More often and at a younger age if you are overweight or have a high risk for diabetes. What should I know about preventing infection? Hepatitis B If you have a higher risk for hepatitis B, you should be screened for this virus. Talk with your health care provider to find out if you are at risk for hepatitis B infection. Hepatitis C Testing is recommended for:  Everyone born from 1945 through 1965.  Anyone with known risk factors for hepatitis C. Sexually transmitted infections (STIs)  Get screened for STIs, including gonorrhea and chlamydia, if: ? You are sexually active and are younger than 39 years of age. ? You are older than 39 years of age and your health care provider tells you that you are at risk for this type of infection. ? Your sexual activity has changed since you were last screened, and you are at increased risk for chlamydia or gonorrhea. Ask your health care provider if   you are at risk.  Ask your health care provider about whether you are at high risk for HIV. Your health care provider may recommend a prescription medicine to help prevent HIV infection. If you choose to take medicine to prevent HIV, you should first get tested for HIV. You should then be tested every 3 months for as long as you are taking the medicine. Pregnancy  If you are about to stop having your period (premenopausal) and  you may become pregnant, seek counseling before you get pregnant.  Take 400 to 800 micrograms (mcg) of folic acid every day if you become pregnant.  Ask for birth control (contraception) if you want to prevent pregnancy. Osteoporosis and menopause Osteoporosis is a disease in which the bones lose minerals and strength with aging. This can result in bone fractures. If you are 65 years old or older, or if you are at risk for osteoporosis and fractures, ask your health care provider if you should:  Be screened for bone loss.  Take a calcium or vitamin D supplement to lower your risk of fractures.  Be given hormone replacement therapy (HRT) to treat symptoms of menopause. Follow these instructions at home: Lifestyle  Do not use any products that contain nicotine or tobacco, such as cigarettes, e-cigarettes, and chewing tobacco. If you need help quitting, ask your health care provider.  Do not use street drugs.  Do not share needles.  Ask your health care provider for help if you need support or information about quitting drugs. Alcohol use  Do not drink alcohol if: ? Your health care provider tells you not to drink. ? You are pregnant, may be pregnant, or are planning to become pregnant.  If you drink alcohol: ? Limit how much you use to 0-1 drink a day. ? Limit intake if you are breastfeeding.  Be aware of how much alcohol is in your drink. In the [place] one drink equals one 12 oz bottle of beer (355 mL), one 5 oz glass of wine (148 mL), or one 1 oz glass of hard liquor (44 mL). General instructions  Schedule regular health, dental, and eye exams.  Stay current with your vaccines.  Tell your health care provider if: ? You often feel depressed. ? You have ever been abused or do not feel safe at home. Summary  Adopting a healthy lifestyle and getting preventive care are important in promoting health and wellness.  Follow your health care provider's instructions about healthy  diet, exercising, and getting tested or screened for diseases.  Follow your health care provider's instructions on monitoring your cholesterol and blood pressure. This information is not intended to replace advice given to you by your health care provider. Make sure you discuss any questions you have with your health care provider. Document Revised: 04/17/2018 Document Reviewed: 04/17/2018 Elsevier Patient Education  2020 Elsevier Inc.  

## 2019-11-25 LAB — CBC WITH DIFFERENTIAL/PLATELET
Basophils Absolute: 0.1 10*3/uL (ref 0.0–0.2)
Basos: 1 %
EOS (ABSOLUTE): 0.2 10*3/uL (ref 0.0–0.4)
Eos: 3 %
Hematocrit: 41.9 % (ref 34.0–46.6)
Hemoglobin: 13.8 g/dL (ref 11.1–15.9)
Immature Grans (Abs): 0 10*3/uL (ref 0.0–0.1)
Immature Granulocytes: 0 %
Lymphocytes Absolute: 1.4 10*3/uL (ref 0.7–3.1)
Lymphs: 16 %
MCH: 29.7 pg (ref 26.6–33.0)
MCHC: 32.9 g/dL (ref 31.5–35.7)
MCV: 90 fL (ref 79–97)
Monocytes Absolute: 0.7 10*3/uL (ref 0.1–0.9)
Monocytes: 8 %
Neutrophils Absolute: 6.2 10*3/uL (ref 1.4–7.0)
Neutrophils: 72 %
Platelets: 260 10*3/uL (ref 150–450)
RBC: 4.65 x10E6/uL (ref 3.77–5.28)
RDW: 12.7 % (ref 11.7–15.4)
WBC: 8.6 10*3/uL (ref 3.4–10.8)

## 2019-11-25 LAB — CERVICOVAGINAL ANCILLARY ONLY
Bacterial Vaginitis (gardnerella): NEGATIVE
Candida Glabrata: NEGATIVE
Candida Vaginitis: NEGATIVE
Chlamydia: NEGATIVE
Comment: NEGATIVE
Comment: NEGATIVE
Comment: NEGATIVE
Comment: NEGATIVE
Comment: NEGATIVE
Comment: NORMAL
Neisseria Gonorrhea: NEGATIVE
Trichomonas: NEGATIVE

## 2019-11-25 LAB — CMP14+EGFR
ALT: 20 IU/L (ref 0–32)
AST: 14 IU/L (ref 0–40)
Albumin/Globulin Ratio: 1.9 (ref 1.2–2.2)
Albumin: 4.4 g/dL (ref 3.8–4.8)
Alkaline Phosphatase: 78 IU/L (ref 48–121)
BUN/Creatinine Ratio: 16 (ref 9–23)
BUN: 12 mg/dL (ref 6–20)
Bilirubin Total: 0.4 mg/dL (ref 0.0–1.2)
CO2: 23 mmol/L (ref 20–29)
Calcium: 9 mg/dL (ref 8.7–10.2)
Chloride: 101 mmol/L (ref 96–106)
Creatinine, Ser: 0.73 mg/dL (ref 0.57–1.00)
GFR calc Af Amer: 121 mL/min/{1.73_m2} (ref >59)
GFR calc non Af Amer: 105 mL/min/{1.73_m2} (ref >59)
Globulin, Total: 2.3 g/dL (ref 1.5–4.5)
Glucose: 99 mg/dL (ref 65–99)
Potassium: 4.3 mmol/L (ref 3.5–5.2)
Sodium: 142 mmol/L (ref 134–144)
Total Protein: 6.7 g/dL (ref 6.0–8.5)

## 2019-11-25 LAB — T4, FREE: Free T4: 1.14 ng/dL (ref 0.82–1.77)

## 2019-11-25 LAB — HCV RNA QUANT RFLX ULTRA OR GENOTYP: HCV Quant Baseline: NOT DETECTED IU/mL

## 2019-11-25 LAB — TSH: TSH: 1.56 u[IU]/mL (ref 0.450–4.500)

## 2019-11-25 LAB — VITAMIN D 25 HYDROXY (VIT D DEFICIENCY, FRACTURES): Vit D, 25-Hydroxy: 18.2 ng/mL — ABNORMAL LOW (ref 30.0–100.0)

## 2019-11-26 LAB — CYTOLOGY - PAP
Comment: NEGATIVE
Diagnosis: NEGATIVE
High risk HPV: NEGATIVE

## 2019-11-27 ENCOUNTER — Telehealth: Payer: Self-pay | Admitting: Family Medicine

## 2019-11-27 ENCOUNTER — Encounter: Payer: Self-pay | Admitting: Physician Assistant

## 2019-11-27 ENCOUNTER — Other Ambulatory Visit: Payer: Self-pay | Admitting: Family Medicine

## 2019-11-27 MED ORDER — ERGOCALCIFEROL 1.25 MG (50000 UT) PO CAPS: 50000.0000 [IU] | ORAL_CAPSULE | ORAL | 0 refills | Status: DC

## 2019-11-27 NOTE — Progress Notes (Signed)
Patient has viewed lab results.

## 2019-11-27 NOTE — Telephone Encounter (Signed)
Copied from [place] [id number]. Topic: General - Inquiry >> Nov 27, 2019  2:14 PM Percell Belt A wrote: Reason for CRM: pt returned call about her most recent lab work.  She would like a call back from nurse to talk about the labs

## 2019-11-27 NOTE — Telephone Encounter (Signed)
Patient call was returned and a voicemail was left informing patient to return phone call.

## 2020-01-02 ENCOUNTER — Ambulatory Visit (INDEPENDENT_AMBULATORY_CARE_PROVIDER_SITE_OTHER): Payer: BC Managed Care – PPO | Admitting: Physician Assistant

## 2020-01-02 ENCOUNTER — Encounter: Payer: Self-pay | Admitting: Physician Assistant

## 2020-01-02 VITALS — BP 114/60 | HR 76 | Ht 61.25 in | Wt 189.2 lb

## 2020-01-02 DIAGNOSIS — R1030 Lower abdominal pain, unspecified: Secondary | ICD-10-CM | POA: Diagnosis not present

## 2020-01-02 DIAGNOSIS — R140 Abdominal distension (gaseous): Secondary | ICD-10-CM

## 2020-01-02 DIAGNOSIS — Z800 Family history of malignant neoplasm of digestive organs: Secondary | ICD-10-CM | POA: Diagnosis not present

## 2020-01-02 DIAGNOSIS — N3941 Urge incontinence: Secondary | ICD-10-CM | POA: Diagnosis not present

## 2020-01-02 MED ORDER — SUTAB 1479-225-188 MG PO TABS: 1.0000 | ORAL_TABLET | ORAL | 0 refills | Status: DC

## 2020-01-02 NOTE — Patient Instructions (Signed)
You have been scheduled for a colonoscopy. Please follow written instructions given to you at your visit today.  Please pick up your prep supplies at the pharmacy within the next 1-3 days. If you use inhalers (even only as needed), please bring them with you on the day of your procedure.  Due to recent changes in healthcare laws, you may see the results of your imaging and laboratory studies on MyChart before your provider has had a chance to review them.  We understand that in some cases there may be results that are confusing or concerning to you. Not all laboratory results come back in the same time frame and the provider may be waiting for multiple results in order to interpret others.  Please give [name] [name] 48 hours in order for your provider to thoroughly review all the results before contacting the office for clarification of your results.
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
## 2020-01-02 NOTE — Progress Notes (Signed)
Reviewed and agree with management plans. ? ?Hareem Surowiec L. Georgeann Brinkman, MD, MPH  ?

## 2020-01-15 ENCOUNTER — Encounter: Payer: Self-pay | Admitting: Gastroenterology

## 2020-01-23 ENCOUNTER — Ambulatory Visit (AMBULATORY_SURGERY_CENTER): Payer: BC Managed Care – PPO | Admitting: Gastroenterology

## 2020-01-23 ENCOUNTER — Encounter: Payer: Self-pay | Admitting: Gastroenterology

## 2020-01-23 ENCOUNTER — Other Ambulatory Visit: Payer: Self-pay

## 2020-01-23 ENCOUNTER — Other Ambulatory Visit: Payer: Self-pay | Admitting: Gastroenterology

## 2020-01-23 VITALS — BP 113/64 | HR 70 | Temp 98.4°F | Resp 14 | Ht 66.0 in | Wt 189.0 lb

## 2020-01-23 DIAGNOSIS — Z800 Family history of malignant neoplasm of digestive organs: Secondary | ICD-10-CM

## 2020-01-23 DIAGNOSIS — D122 Benign neoplasm of ascending colon: Secondary | ICD-10-CM | POA: Diagnosis not present

## 2020-01-23 DIAGNOSIS — Z1211 Encounter for screening for malignant neoplasm of colon: Secondary | ICD-10-CM

## 2020-01-23 MED ORDER — SODIUM CHLORIDE 0.9 % IV SOLN: 500.0000 mL | Freq: Once | INTRAVENOUS | Status: DC

## 2020-01-23 NOTE — Progress Notes (Signed)
SP vitals.

## 2020-01-23 NOTE — Progress Notes (Signed)
Called to room to assist during endoscopic procedure.  Patient ID and intended procedure confirmed with present staff. Received instructions for my participation in the procedure from the performing physician.  

## 2020-01-23 NOTE — Progress Notes (Signed)
To PACU< VSS. Report to [email or web address] 

## 2020-01-23 NOTE — Patient Instructions (Signed)
Handouts Provided:  Polyps  YOU HAD AN ENDOSCOPIC PROCEDURE TODAY AT THE [facility]:   Refer to the procedure report that was given to you for any specific questions about what was found during the examination.  If the procedure report does not answer your questions, please call your gastroenterologist to clarify.  If you requested that your care partner not be given the details of your procedure findings, then the procedure report has been included in a sealed envelope for you to review at your convenience later.  YOU SHOULD EXPECT: Some feelings of bloating in the abdomen. Passage of more gas than usual.  Walking can help get rid of the air that was put into your GI tract during the procedure and reduce the bloating. If you had a lower endoscopy (such as a colonoscopy or flexible sigmoidoscopy) you may notice spotting of blood in your stool or on the toilet paper. If you underwent a bowel prep for your procedure, you may not have a normal bowel movement for a few days.  Please Note:  You might notice some irritation and congestion in your nose or some drainage.  This is from the oxygen used during your procedure.  There is no need for concern and it should clear up in a day or so.  SYMPTOMS TO REPORT IMMEDIATELY:   Following lower endoscopy (colonoscopy or flexible sigmoidoscopy):  Excessive amounts of blood in the stool  Significant tenderness or worsening of abdominal pains  Swelling of the abdomen that is new, acute  Fever of 100F or higher  For urgent or emergent issues, a gastroenterologist can be reached at any hour by calling [phone]. Do not use MyChart messaging for urgent concerns.    DIET:  We do recommend a small meal at first, but then you may proceed to your regular diet.  Drink plenty of fluids but you should avoid alcoholic beverages for 24 hours.  ACTIVITY:  You should plan to take it easy for the rest of today and you should NOT DRIVE or use heavy  machinery until tomorrow (because of the sedation medicines used during the test).    FOLLOW UP: Our staff will call the number listed on your records 48-72 hours following your procedure to check on you and address any questions or concerns that you may have regarding the information given to you following your procedure. If we do not reach you, we will leave a message.  We will attempt to reach you two times.  During this call, we will ask if you have developed any symptoms of COVID 19. If you develop any symptoms (ie: fever, flu-like symptoms, shortness of breath, cough etc.) before then, please call [phone].  If you test positive for Covid 19 in the 2 weeks post procedure, please call and report this information to us.    If any biopsies were taken you will be contacted by phone or by letter within the next 1-3 weeks.  Please call us at [phone] if you have not heard about the biopsies in 3 weeks.    SIGNATURES/CONFIDENTIALITY: You and/or your care partner have signed paperwork which will be entered into your electronic medical record.  These signatures attest to the fact that that the information above on your After Visit Summary has been reviewed and is understood.  Full responsibility of the confidentiality of this discharge information lies with you and/or your care-partner.  

## 2020-01-23 NOTE — Op Note (Signed)
[facility] Patient Name: [name] [name] Procedure Date: 01/23/2020 10:56 AM MRN: [id number] Endoscopist: Thornton Park MD, MD Age: 39 Referring MD:  Date of Birth: [birth year] Gender: Female Account #: [id number] Procedure:                Colonoscopy Indications:              Screening in patient at increased risk: Family                            history of 1st-degree relative with colorectal                            cancer before age 74 years                           Father with colon cancer at age 82 Medicines:                Monitored Anesthesia Care Procedure:                Pre-Anesthesia Assessment:                           - Prior to the procedure, a History and Physical                            was performed, and patient medications and                            allergies were reviewed. The patient's tolerance of                            previous anesthesia was also reviewed. The risks                            and benefits of the procedure and the sedation                            options and risks were discussed with the patient.                            All questions were answered, and informed consent                            was obtained. Prior Anticoagulants: The patient has                            taken no previous anticoagulant or antiplatelet                            agents. ASA Grade Assessment: II - A patient with                            mild systemic disease. After reviewing the risks  and benefits, the patient was deemed in                            satisfactory condition to undergo the procedure.                           After obtaining informed consent, the colonoscope                            was passed under direct vision. Throughout the                            procedure, the patient's blood pressure, pulse, and                            oxygen saturations were monitored continuously. The                             Colonoscope was introduced through the anus and                            advanced to the 3 cm into the ileum. The                            colonoscopy was performed without difficulty. The                            patient tolerated the procedure well. The quality                            of the bowel preparation was excellent. The                            terminal ileum, ileocecal valve, appendiceal                            orifice, and rectum were photographed. Scope In: 11:07:16 AM Scope Out: 11:20:59 AM Scope Withdrawal Time: 0 hours 10 minutes 46 seconds  Total Procedure Duration: 0 hours 13 minutes 43 seconds  Findings:                 The perianal and digital rectal examinations were                            normal.                           A 5 mm polyp was found in the ascending colon. The                            polyp was sessile. The polyp was removed with a                            cold snare. Resection and retrieval were complete.  Estimated blood loss was minimal.                           The entire examined colon appeared normal on direct                            and retroflexion views. Complications:            No immediate complications. Estimated blood loss:                            Minimal. Estimated Blood Loss:     Estimated blood loss was minimal. Impression:               - One 8 mm polyp in the ascending colon, removed                            with a cold snare. Resected and retrieved.                           - The entire examined colon is normal on direct and                            retroflexion views. Recommendation:           - Patient has a contact number available for                            emergencies. The signs and symptoms of potential                            delayed complications were discussed with the                            patient. Return to normal activities  tomorrow.                            Written discharge instructions were provided to the                            patient.                           - Resume previous diet.                           - Continue present medications.                           - Await pathology results.                           - Repeat colonoscopy date to be determined after                            pending pathology results are reviewed for  surveillance.                           - Emerging evidence supports eating a diet of                            fruits, vegetables, grains, calcium, and yogurt                            while reducing red meat and alcohol may reduce the                            risk of colon cancer.                           - Thank you for allowing me to be involved in your                            colon cancer prevention. Thornton Park MD, MD 01/23/2020 11:27:51 AM This report has been signed electronically.

## 2020-01-27 ENCOUNTER — Telehealth: Payer: Self-pay

## 2020-01-27 NOTE — Telephone Encounter (Signed)
°  Follow up Call-  Call back number 01/23/2020  Post procedure Call Back phone  # [phone]  Permission to leave phone message Yes  Some recent data might be hidden     Patient questions:  Do you have a fever, pain , or abdominal swelling? No. Pain Score  0 *  Have you tolerated food without any problems? Yes.    Have you been able to return to your normal activities? Yes.    Do you have any questions about your discharge instructions: Diet   No. Medications  No. Follow up visit  No.  Do you have questions or concerns about your Care? No.  Actions: * If pain score is 4 or above: No action needed, pain <4.  1. Have you developed a fever since your procedure? no  2.   Have you had an respiratory symptoms (SOB or cough) since your procedure? no  3.   Have you tested positive for COVID 19 since your procedure no  4.   Have you had any family members/close contacts diagnosed with the COVID 19 since your procedure?  no   If yes to any of these questions please route to Joylene John, RN and Joella Prince, RN

## 2020-01-28 ENCOUNTER — Encounter: Payer: Self-pay | Admitting: Gastroenterology

## 2020-02-23 ENCOUNTER — Ambulatory Visit: Payer: BC Managed Care – PPO | Admitting: Family Medicine

## 2020-03-05 DIAGNOSIS — Z23 Encounter for immunization: Secondary | ICD-10-CM | POA: Diagnosis not present

## 2020-03-10 ENCOUNTER — Other Ambulatory Visit: Payer: Self-pay

## 2020-03-10 ENCOUNTER — Encounter: Payer: Self-pay | Admitting: Family Medicine

## 2020-03-10 ENCOUNTER — Ambulatory Visit: Payer: BC Managed Care – PPO | Attending: Family Medicine | Admitting: Family Medicine

## 2020-03-10 DIAGNOSIS — D369 Benign neoplasm, unspecified site: Secondary | ICD-10-CM | POA: Diagnosis not present

## 2020-03-10 DIAGNOSIS — F32A Depression, unspecified: Secondary | ICD-10-CM | POA: Diagnosis not present

## 2020-03-10 DIAGNOSIS — F419 Anxiety disorder, unspecified: Secondary | ICD-10-CM

## 2020-03-10 MED ORDER — FLUOXETINE HCL 20 MG PO TABS: 20.0000 mg | ORAL_TABLET | Freq: Every day | ORAL | 3 refills | Status: DC
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
## 2020-03-10 NOTE — Progress Notes (Signed)
States that her colonoscopy revealed cancer.  Wants to discuss antidepressants.

## 2020-03-17 ENCOUNTER — Telehealth: Payer: Self-pay | Admitting: Family Medicine

## 2020-03-17 MED ORDER — FLUOXETINE HCL 20 MG PO CAPS: 20.0000 mg | ORAL_CAPSULE | Freq: Every day | ORAL | 3 refills | Status: DC

## 2020-03-17 NOTE — Telephone Encounter (Signed)
Please refill if indicated! Thank you . 

## 2020-03-17 NOTE — Telephone Encounter (Signed)
Pharmacy called and is requesting to have the pts fluoxetine sent in as capsules instead of tablets as it is cheaper for the pt. Please advise.      [place]  [place] [place] [address]  Phone: [phone] Fax: [phone]  Hours: Not open 24 hours

## 2020-03-17 NOTE — Telephone Encounter (Signed)
Done

## 2020-04-07 ENCOUNTER — Ambulatory Visit: Payer: BC Managed Care – PPO | Attending: Family Medicine | Admitting: Licensed Clinical Social Worker

## 2020-04-07 ENCOUNTER — Other Ambulatory Visit: Payer: Self-pay

## 2020-04-07 DIAGNOSIS — F331 Major depressive disorder, recurrent, moderate: Secondary | ICD-10-CM

## 2020-04-09 NOTE — BH Specialist Note (Signed)
Integrated Behavioral Health Initial In-Person Visit  MRN: [id number] Name: [name] [name]  Number of [place] Clinician visits:: 1/6 Session Start time: 4:00 PM  Session End time: 4:35 PM Total time: 35  minutes  Types of Service: Individual psychotherapy  Interpretor:No. Interpretor Name and Language: NA   Subjective: [name] [name] is a 39 y.o. female accompanied by self Patient was referred by Dr. Margarita Rana for anxiety and depression. Patient reports the following symptoms/concerns: Pt reports difficulty managing depression and anxiety. Symptoms include low motivation/energy, forgetfulness, oversleeping, weight fluctuates, and hx of suicidal ideations Duration of problem: "For awhile" Pt reports receiving medication management and therapy as a teenager; Severity of problem: moderate  Objective: Mood: Anxious and Depressed and Affect: Tearful Risk of harm to self or others: No plan to harm self or others Pt reports hx of suicidal ideations. She endorses three previous suicidal attempts. Currently, pt denies suicidal or homicidal ideations  Life Context: Family and Social: Pt resides with minor children and spouse School/Work: Pt is employed and insured Self-Care: Pt is participating in medication management through PCP Life Changes: Pt reports strain in marriage and difficulty managing chronic medical conditions  Patient and/or Family's Strengths/Protective Factors: Social connections, Social and Emotional competence, Concrete supports in place (healthy food, safe environments, etc.) and Sense of purpose  Goals Addressed: Patient will: 1. Reduce symptoms of: anxiety and depression Pt agreed to continue compliance with medication management 2. Increase knowledge and/or ability of: coping skills Pt agreed to utilize meditation to cope with stressors   Progress towards Goals: Ongoing  Interventions: Interventions utilized: Mindfulness or Psychologist, educational,  Supportive Counseling and Psychoeducation and/or Health Education  Standardized Assessments completed: GAD-7 and PHQ 2&9  Patient and/or Family Response: Pt was engaged during session and was successful in identifying strategies to assist with management of symptoms. She is interested in medication management and therapy  Patient Centered Plan: Patient is on the following Treatment Plan(s):  Anxiety and Depression   Assessment: Patient currently experiencing difficulty managing depression and anxiety symptoms triggered by chronic medical conditions and strain in marriage. Pt denies current suicidal ideations. Protective factors identified (children) and crisis intervention resources provided.    Patient may benefit from therapy and continued medication management.  Plan: 1. Follow up with behavioral health clinician on : 04/22/20 2. Behavioral recommendations: Utilize strategies discussed and resources provided 3. Referral(s): [place] (In Clinic) 4. "From scale of 1-10, how likely are you to follow plan?":   Rebekah Chesterfield, LCSW 04/09/2020 10:24 AM

## 2020-04-22 ENCOUNTER — Other Ambulatory Visit: Payer: Self-pay

## 2020-04-22 ENCOUNTER — Ambulatory Visit: Payer: BC Managed Care – PPO | Attending: Family Medicine | Admitting: Licensed Clinical Social Worker

## 2020-04-22 DIAGNOSIS — F331 Major depressive disorder, recurrent, moderate: Secondary | ICD-10-CM | POA: Diagnosis not present

## 2020-04-22 NOTE — BH Specialist Note (Signed)
Integrated Behavioral Health Follow Up In-Person Visit  MRN: [id number] Name: [name] [name]  Number of [place] Clinician visits: 2/6 Session Start time: 4:30 PM  Session End time: 5:00 PM Total time: 30 minutes  Types of Service: Individual psychotherapy  Interpretor:No. Interpretor Name and Language: NA  Subjective: [name] [name] is a 39 y.o. female accompanied by self Patient was referred by Dr. Margarita Rana for depression and anxiety. Patient reports the following symptoms/concerns: Pt reports increase in symptoms triggered by mother's recent decline in health and ongoing strain in relationship with partner Duration of problem: Ongoing; Severity of problem: moderate  Objective: Mood: Anxious and Affect: Appropriate Risk of harm to self or others: No plan to harm self or others  Life Context: Family and Social: Pt resides with minor children and partner School/Work: Pt is employed and insured Self-Care: Pt is participating in medication management through PCP Life Changes: Pt is experiencing increase in stress triggered by mother's health conditions and strain in relationship with partner. She receives limited support  Patient and/or Family's Strengths/Protective Factors: Social connections, Social and Emotional competence, Concrete supports in place (healthy food, safe environments, etc.) and Sense of purpose  Goals Addressed: Patient will: 1.  Reduce symptoms of: anxiety, depression and stress Pt agreed to continue compliance with medication management 2.  Increase knowledge and/or ability of: coping skills Pt agreed to engage in self-care activities (girl's night) that promote positive feelings   Progress towards Goals: Ongoing  Interventions: Interventions utilized:  Behavioral Activation and Supportive Counseling Standardized Assessments completed: GAD-7 and PHQ 2&9  Patient Response: Pt was engaged in session and was active in identifying strategies to  manage symptoms  Patient Centered Plan: Patient is on the following Treatment Plan(s): Anxiety and Depression  Assessment: Patient currently experiencing difficulty managing depression and anxiety symptoms triggered by mother's declining health and strain in relationship with partner.   Patient may benefit from continued medication management and therapy.  Plan: 1. Follow up with behavioral health clinician on : 05/06/2020 2. Behavioral recommendations: Utilize strategies discussed and continue with medication management 3. Referral(s): [place] (In Clinic) 4. "From scale of 1-10, how likely are you to follow plan?":   Rebekah Chesterfield, LCSW  04/22/20 11:38 PM

## 2020-05-06 ENCOUNTER — Ambulatory Visit: Payer: BC Managed Care – PPO | Admitting: Licensed Clinical Social Worker

## 2020-05-24 ENCOUNTER — Other Ambulatory Visit: Payer: Self-pay

## 2020-05-24 ENCOUNTER — Telehealth: Payer: Self-pay | Admitting: Family Medicine

## 2020-05-24 ENCOUNTER — Ambulatory Visit: Payer: BC Managed Care – PPO | Attending: Family Medicine | Admitting: Licensed Clinical Social Worker

## 2020-05-24 NOTE — Telephone Encounter (Signed)
Called patient and LVM letting her know that her appointment for today had been switched from in person to virtual due to the office being closed because of inclement weather. Advised patient to call with any questions or concerns or if she would like to reschedule for an in person appointment at [phone].

## 2020-06-07 ENCOUNTER — Other Ambulatory Visit: Payer: Self-pay

## 2020-06-07 ENCOUNTER — Ambulatory Visit: Payer: BC Managed Care – PPO | Attending: Family Medicine | Admitting: Licensed Clinical Social Worker

## 2020-06-07 DIAGNOSIS — F331 Major depressive disorder, recurrent, moderate: Secondary | ICD-10-CM

## 2020-06-15 NOTE — BH Specialist Note (Signed)
Integrated Behavioral Health via Telemedicine Visit  06/07/20 [name] [name] [id number]  Number of [place] visits: 3 Session Start time: 4:45 PM  Session End time: 5:15 PM Total time: 30  Referring Provider: Dr. Margarita Rana Patient location: Home [facility] Provider location: Office All persons participating in visit: LCSW and Patient Types of Service: Individual psychotherapy  I connected with [name] [name] by Telephone  (Video is Careg[employer] application) and verified that I am speaking with the correct person using two identifiers.Discussed confidentiality: Yes   I discussed the limitations of telemedicine and the availability of in person appointments.  Discussed there is a possibility of technology failure and discussed alternative modes of communication if that failure occurs.  I discussed that engaging in this telemedicine visit, they consent to the provision of behavioral healthcare and the services will be billed under their insurance.  Patient and/or legal guardian expressed understanding and consented to Telemedicine visit: Yes   Presenting Concerns: Patient and/or family reports the following symptoms/concerns: Pt reports difficulty managing depression and anxiety symptoms triggered by negative feelings regarding body image. Pt shared communication in with partner has improved despite his difficulty managing temper. Pt endorses low energy and self-esteem due to weight gain Duration of problem: Ongoing; Severity of problem: moderate  Patient and/or Family's Strengths/Protective Factors: Social connections, Social and Emotional competence, Concrete supports in place (healthy food, safe environments, etc.) and Sense of purpose  Goals Addressed: Patient will: 1.  Reduce symptoms of: anxiety and depression Pt agreed to continue with compliance of medications 2.  Increase knowledge and/or ability of: self-management skills Pt agreed to journal things that she likes about  self to discuss at next scheduled appt. Pt will continue to utilize positive affirmations 3.  Demonstrate ability to: Increase healthy adjustment to current life circumstances and Increase adequate support systems for patient/family Pt agreed to schedule a three month follow up appt with PCP to address pt's concerns (foot pain, cholesterol, and nutritionist referral)  Progress towards Goals: Ongoing  Interventions: Interventions utilized:  CBT Cognitive Behavioral Therapy Standardized Assessments completed: Not Needed  Patient Response: Pt was engaged during session and was successful in identifying goals to assist with management of symptoms.   Assessment: Patient currently experiencing depression and anxiety symptoms.    Patient may benefit from continued therapy and compliance with medications. Pt identified triggers to mental health symptoms and barriers to treatment in the past. Pt is interested in improving body positivity and stengthening intimacy with partner.   Plan: 1. Follow up with behavioral health clinician on : Schedule follow up appt 2. Behavioral recommendations: Utilize strategies discussed, comply with meds, and schedule f/up appt with PCP 3. Referral(s): [place] (In Clinic)  I discussed the assessment and treatment plan with the patient and/or parent/guardian. They were provided an opportunity to ask questions and all were answered. They agreed with the plan and demonstrated an understanding of the instructions.   They were advised to call back or seek an in-person evaluation if the symptoms worsen or if the condition fails to improve as anticipated.  Rebekah Chesterfield, LCSW  06/15/20 2:33 PM

## 2020-07-21 DIAGNOSIS — J3089 Other allergic rhinitis: Secondary | ICD-10-CM | POA: Diagnosis not present

## 2020-08-13 ENCOUNTER — Encounter: Payer: Self-pay | Admitting: Family Medicine

## 2020-08-13 ENCOUNTER — Other Ambulatory Visit: Payer: Self-pay | Admitting: Family Medicine

## 2020-08-13 MED ORDER — FLUOXETINE HCL 20 MG PO CAPS
20.0000 mg | ORAL_CAPSULE | Freq: Every day | ORAL | 3 refills | Status: DC
Start: 2020-08-13 — End: 2021-09-14

## 2020-08-24 ENCOUNTER — Encounter: Payer: Self-pay | Admitting: Family Medicine

## 2020-08-24 ENCOUNTER — Other Ambulatory Visit: Payer: Self-pay

## 2020-08-24 ENCOUNTER — Ambulatory Visit: Payer: BC Managed Care – PPO | Attending: Family Medicine | Admitting: Family Medicine

## 2020-08-24 VITALS — BP 114/74 | HR 64 | Ht 66.0 in | Wt 185.8 lb

## 2020-08-24 DIAGNOSIS — M79671 Pain in right foot: Secondary | ICD-10-CM | POA: Diagnosis not present

## 2020-08-24 DIAGNOSIS — F419 Anxiety disorder, unspecified: Secondary | ICD-10-CM | POA: Diagnosis not present

## 2020-08-24 DIAGNOSIS — E663 Overweight: Secondary | ICD-10-CM

## 2020-08-24 DIAGNOSIS — F32A Depression, unspecified: Secondary | ICD-10-CM

## 2020-08-24 DIAGNOSIS — M79672 Pain in left foot: Secondary | ICD-10-CM

## 2020-08-24 DIAGNOSIS — Z13228 Encounter for screening for other metabolic disorders: Secondary | ICD-10-CM
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
## 2020-08-24 NOTE — Progress Notes (Signed)
Having pain in both feet on bottom.

## 2020-09-02 ENCOUNTER — Other Ambulatory Visit: Payer: Self-pay

## 2020-09-02 ENCOUNTER — Ambulatory Visit: Payer: BC Managed Care – PPO | Attending: Family Medicine

## 2020-09-02 DIAGNOSIS — Z13228 Encounter for screening for other metabolic disorders: Secondary | ICD-10-CM | POA: Diagnosis not present

## 2020-09-03 LAB — CMP14+EGFR
ALT: 18 IU/L (ref 0–32)
AST: 12 IU/L (ref 0–40)
Albumin/Globulin Ratio: 2 (ref 1.2–2.2)
Albumin: 4.4 g/dL (ref 3.8–4.8)
Alkaline Phosphatase: 75 IU/L (ref 44–121)
BUN/Creatinine Ratio: 13 (ref 9–23)
BUN: 9 mg/dL (ref 6–20)
Bilirubin Total: 0.4 mg/dL (ref 0.0–1.2)
CO2: 23 mmol/L (ref 20–29)
Calcium: 9.2 mg/dL (ref 8.7–10.2)
Chloride: 104 mmol/L (ref 96–106)
Creatinine, Ser: 0.71 mg/dL (ref 0.57–1.00)
Globulin, Total: 2.2 g/dL (ref 1.5–4.5)
Glucose: 101 mg/dL — ABNORMAL HIGH (ref 65–99)
Potassium: 4.5 mmol/L (ref 3.5–5.2)
Sodium: 140 mmol/L (ref 134–144)
Total Protein: 6.6 g/dL (ref 6.0–8.5)
eGFR: 111 mL/min/{1.73_m2} (ref >59)

## 2020-09-03 LAB — LIPID PANEL
Chol/HDL Ratio: 3.1 ratio (ref 0.0–4.4)
Cholesterol, Total: 179 mg/dL (ref 100–199)
HDL: 57 mg/dL (ref >39)
LDL Chol Calc (NIH): 107 mg/dL — ABNORMAL HIGH (ref 0–99)
Triglycerides: 81 mg/dL (ref 0–149)
VLDL Cholesterol Cal: 15 mg/dL (ref 5–40)

## 2020-09-03 LAB — HEMOGLOBIN A1C
Est. average glucose Bld gHb Est-mCnc: 114 mg/dL
Hgb A1c MFr Bld: 5.6 % (ref 4.8–5.6)

## 2020-09-06 ENCOUNTER — Encounter: Payer: Self-pay | Admitting: Family Medicine

## 2020-09-07 ENCOUNTER — Other Ambulatory Visit: Payer: Self-pay

## 2020-09-07 ENCOUNTER — Ambulatory Visit (INDEPENDENT_AMBULATORY_CARE_PROVIDER_SITE_OTHER): Payer: BC Managed Care – PPO | Admitting: Podiatry

## 2020-09-07 ENCOUNTER — Ambulatory Visit (INDEPENDENT_AMBULATORY_CARE_PROVIDER_SITE_OTHER): Payer: BC Managed Care – PPO

## 2020-09-07 DIAGNOSIS — M79672 Pain in left foot: Secondary | ICD-10-CM | POA: Diagnosis not present

## 2020-09-07 DIAGNOSIS — S93491A Sprain of other ligament of right ankle, initial encounter: Secondary | ICD-10-CM

## 2020-09-07 DIAGNOSIS — T148XXA Other injury of unspecified body region, initial encounter: Secondary | ICD-10-CM | POA: Diagnosis not present

## 2020-09-07 DIAGNOSIS — M79671 Pain in right foot: Secondary | ICD-10-CM

## 2020-09-07 DIAGNOSIS — M7989 Other specified soft tissue disorders: Secondary | ICD-10-CM | POA: Diagnosis not present

## 2020-09-07 DIAGNOSIS — S93492D Sprain of other ligament of left ankle, subsequent encounter: Secondary | ICD-10-CM

## 2020-09-07 NOTE — Patient Instructions (Signed)
I have ordered an MRI of both ankles. If you do not hear for them about scheduling within the next 1 week, or you have any questions please give [place] a call at [phone].

## 2020-09-14 ENCOUNTER — Other Ambulatory Visit: Payer: Self-pay | Admitting: Podiatry

## 2020-09-14 DIAGNOSIS — S93491A Sprain of other ligament of right ankle, initial encounter: Secondary | ICD-10-CM
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
## 2020-09-20 ENCOUNTER — Telehealth: Payer: Self-pay | Admitting: *Deleted

## 2020-09-20 NOTE — Telephone Encounter (Signed)
Patient has an MRI appointment on 09-26-2020 at 7:00 am. Lattie Haw

## 2020-09-20 NOTE — Telephone Encounter (Signed)
-----   Message from Trula Slade, DPM sent at 09/14/2020  7:17 AM EDT ----- I ordered bilateral ankle MRI if you can please follow up on this. Thank you.

## 2020-09-26 ENCOUNTER — Ambulatory Visit
Admission: RE | Admit: 2020-09-26 | Discharge: 2020-09-26 | Disposition: A | Discharge status: Home / Self Care | Payer: BC Managed Care – PPO | Source: Ambulatory Visit | Attending: Podiatry | Admitting: Podiatry

## 2020-09-26 ENCOUNTER — Other Ambulatory Visit: Payer: Self-pay

## 2020-09-26 DIAGNOSIS — M67472 Ganglion, left ankle and foot: Secondary | ICD-10-CM | POA: Diagnosis not present

## 2020-09-26 DIAGNOSIS — T148XXA Other injury of unspecified body region, initial encounter: Secondary | ICD-10-CM

## 2020-09-26 DIAGNOSIS — S86312A Strain of muscle(s) and tendon(s) of peroneal muscle group at lower leg level, left leg, initial encounter: Secondary | ICD-10-CM | POA: Diagnosis not present

## 2020-09-26 DIAGNOSIS — S93412A Sprain of calcaneofibular ligament of left ankle, initial encounter: Secondary | ICD-10-CM | POA: Diagnosis not present

## 2020-09-26 DIAGNOSIS — M65871 Other synovitis and tenosynovitis, right ankle and foot: Secondary | ICD-10-CM | POA: Diagnosis not present

## 2020-09-26 DIAGNOSIS — M65872 Other synovitis and tenosynovitis, left ankle and foot: Secondary | ICD-10-CM | POA: Diagnosis not present

## 2020-09-26 DIAGNOSIS — M67471 Ganglion, right ankle and foot: Secondary | ICD-10-CM | POA: Diagnosis not present

## 2020-09-26 DIAGNOSIS — M7989 Other specified soft tissue disorders: Secondary | ICD-10-CM

## 2020-09-26 DIAGNOSIS — S86311A Strain of muscle(s) and tendon(s) of peroneal muscle group at lower leg level, right leg, initial encounter: Secondary | ICD-10-CM | POA: Diagnosis not present

## 2020-09-26 IMAGING — MR MR ANKLE*L* W/O CM
6 series · 40 of 40 positions shown · non-contrast
Comparison: Radiograph [DATE]

CLINICAL DATA: Ankle pain, instability suspected, neg xray

EXAM:
MRI OF THE LEFT ANKLE WITHOUT CONTRAST
TECHNIQUE: Multiplanar, multisequence MR imaging of the ankle was performed. No
intravenous contrast was administered.

[Series 3: T2 fat-sat · axial · 3.0mm · 0.62mm/px · z∈[-23,+98]mm · 7 of 32 slices shown (1 of 2)]
[im 1/32]
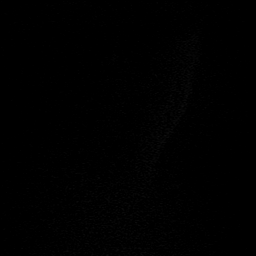
[im 6/32]
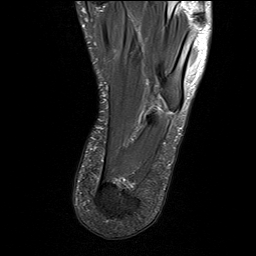
[im 11/32]
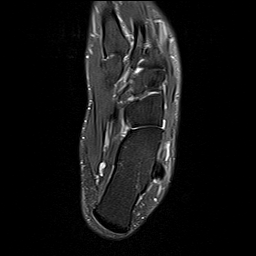
[im 16/32]
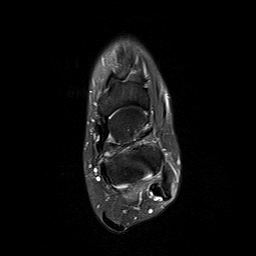
[im 21/32]
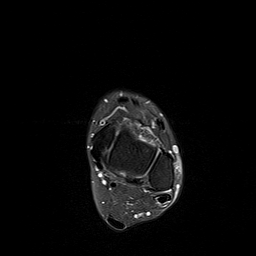
[im 26/32]
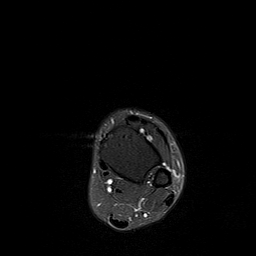
[im 32/32]
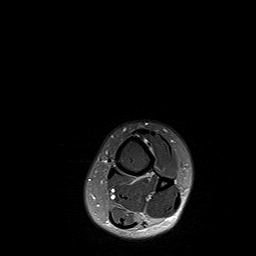

[Series 4: PD fat-sat · axial · 3.0mm · 0.62mm/px · z∈[-23,+98]mm · 8 of 32 slices shown]
[im 1/32]
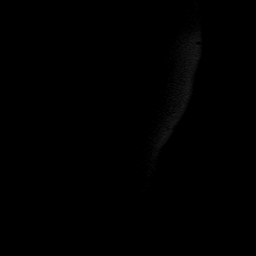
[im 5/32]
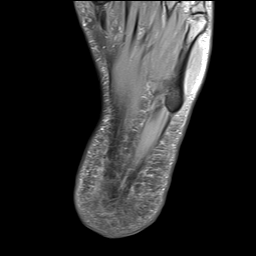
[im 9/32]
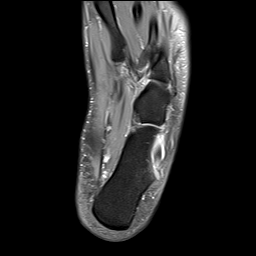
[im 14/32]
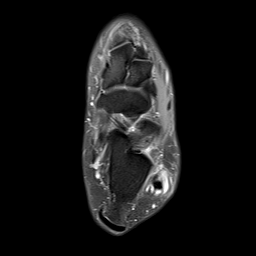
[im 18/32]
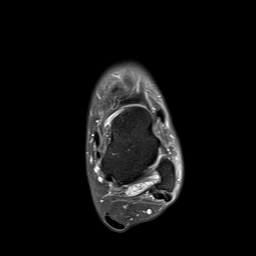
[im 23/32]
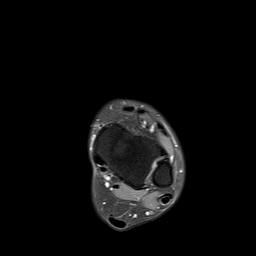
[im 27/32]
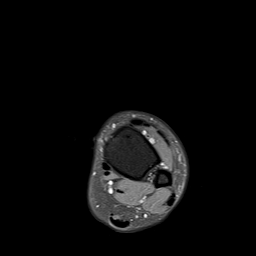
[im 32/32]
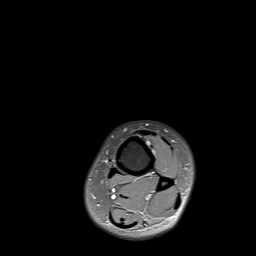

[Series 5: T1 · axial · 3.0mm · 0.62mm/px · z∈[-19,+94]mm · 7 of 30 slices shown (1 of 2)]
[im 1/30]
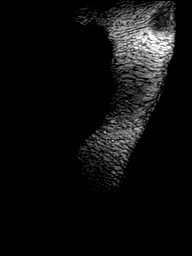
[im 5/30]
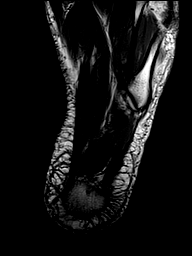
[im 10/30]
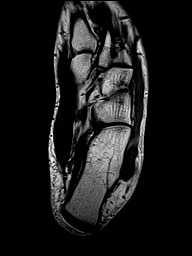
[im 15/30]
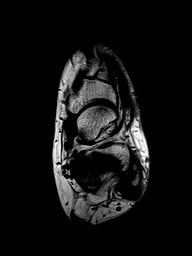
[im 20/30]
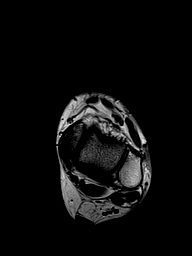
[im 25/30]
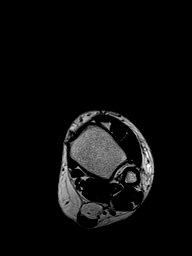
[im 30/30]
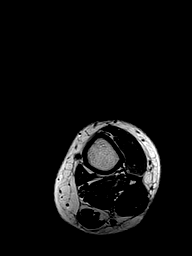

[Series 6: T1 · sagittal · 4.0mm · 0.56mm/px · 5 of 20 slices shown (2 of 2)]
[im 1/20]
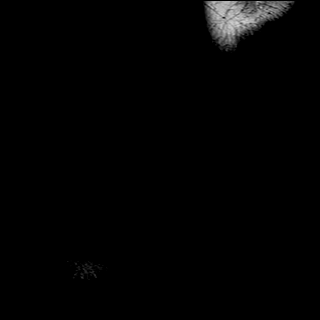
[im 5/20]
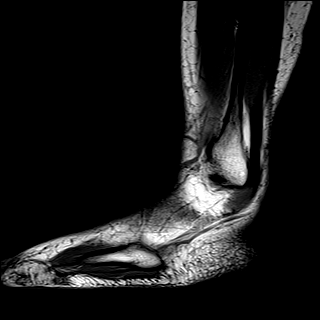
[im 10/20]
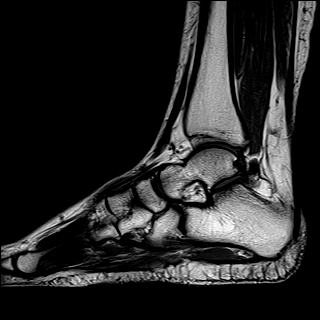
[im 15/20]
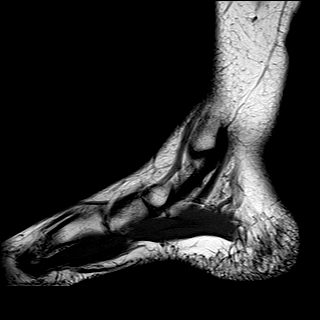
[im 20/20]
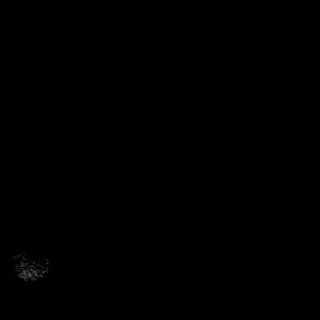

[Series 7: STIR · sagittal · 4.0mm · 0.35mm/px · 5 of 20 slices shown]
[im 1/20]
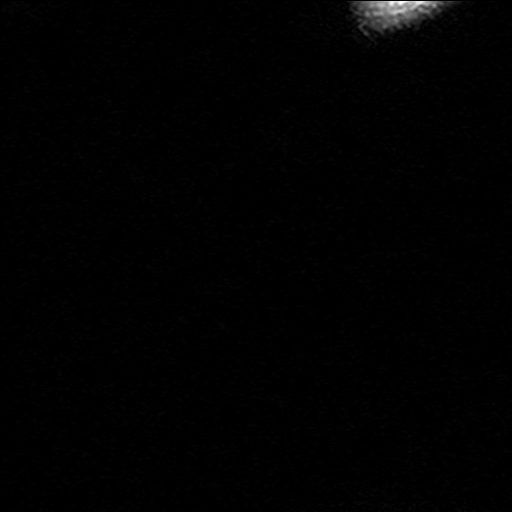
[im 5/20]
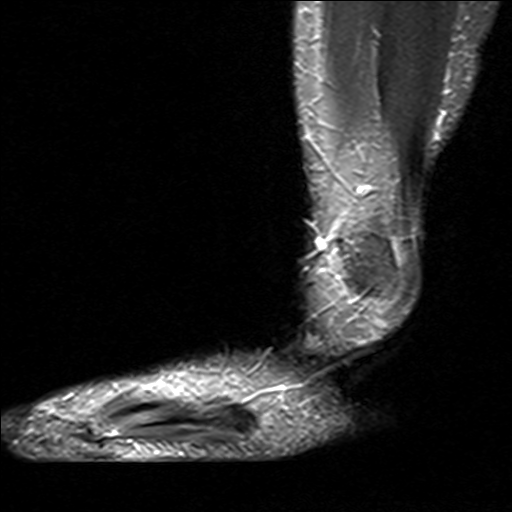
[im 10/20]
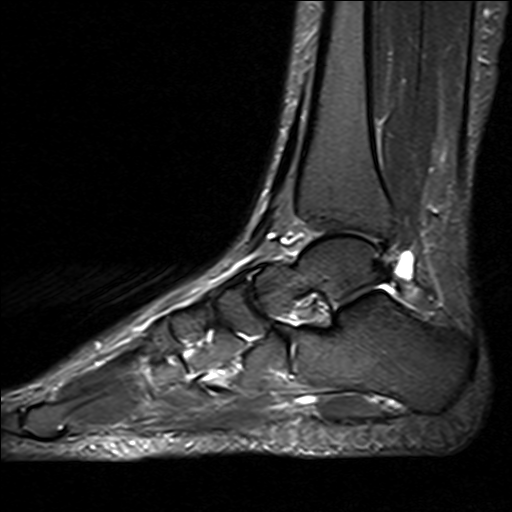
[im 15/20]
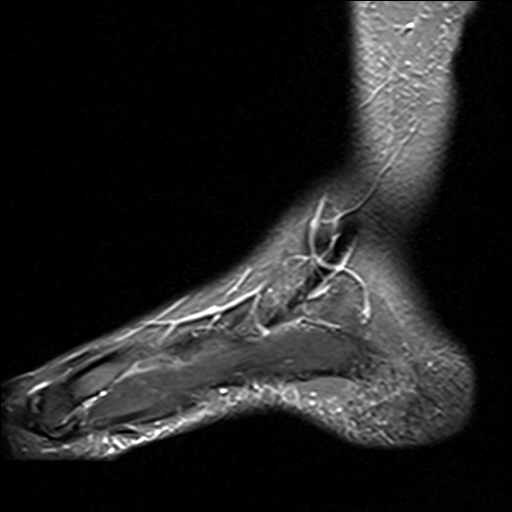
[im 20/20]
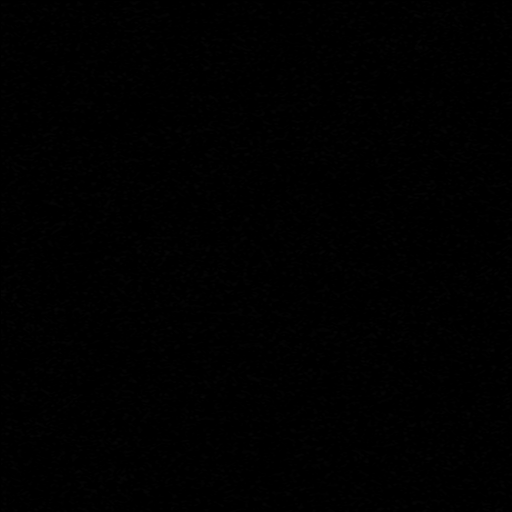

[Series 8: T2 fat-sat · coronal · 3.0mm · 0.50mm/px · 8 of 32 slices shown (2 of 2)]
[im 1/32]
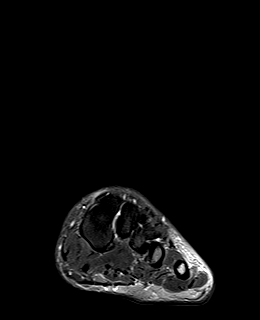
[im 5/32]
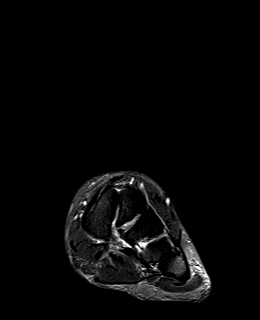
[im 9/32]
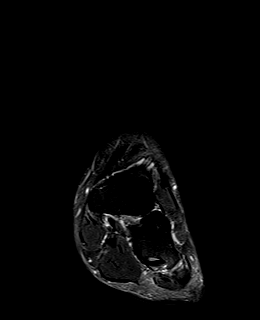
[im 14/32]
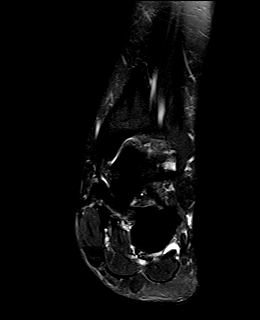
[im 18/32]
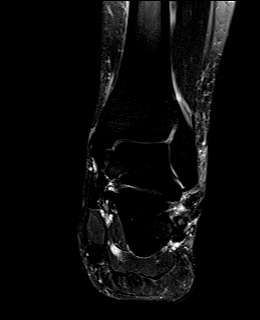
[im 23/32]
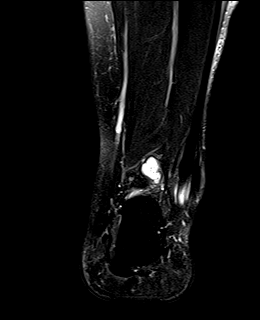
[im 27/32]
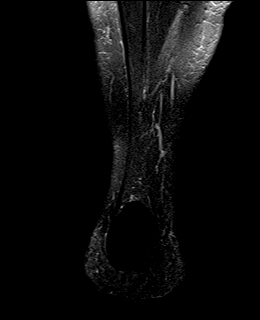
[im 32/32]
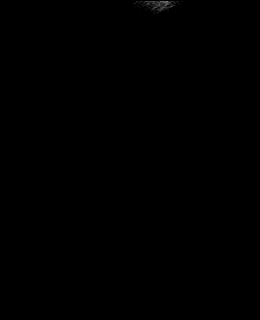

[40 of 40 positions shown; findings below may reference images not displayed]

FINDINGS: TENDONS

Peroneal: There is a split tear of the peroneus brevis tendon at and
just below the lateral malleolus, with continuity distally. There is
tenosynovitis of the peroneus longus tendon above and below the
malleolus.

Posteromedial: Posterior tibial tendon intact. Flexor hallucis
longus tendon intact. Flexor digitorum longus tendon intact.

Anterior: Tibialis anterior tendon intact. Extensor hallucis longus
tendon intact Extensor digitorum longus tendon intact.

Achilles:  Intact.

Plantar Fascia: Intact.

LIGAMENTS

Lateral: Mild thickening of the ATFL and calcaneofibular ligaments
suggesting chronic sprain. The PT FL is intact, with globular cystic
appearing lesion along its course compatible with a ganglion
measuring 2.3 x 0.7 x 1.0 cm. The anterior and posterior
tibiofibular ligaments intact.

Medial: Deltoid ligament intact. Spring ligament intact.

CARTILAGE

Ankle Joint: No joint effusion. Normal ankle mortise. No chondral
defect.

Subtalar Joints/Sinus Tarsi: Normal subtalar joints. No subtalar
joint effusion. Normal sinus tarsi.

Bones: No marrow signal abnormality.  No fracture or dislocation.

Soft Tissue: No fluid collection or hematoma. Muscles are normal
without edema or atrophy. Tarsal tunnel is normal.
IMPRESSION: Split peroneus brevis tear at and just below the lateral malleolus
with continuity distally. Tenosynovitis of the peroneus longus
tendon above and below the lateral malleolus.

Chronic ATFL and calcaneofibular ligament sprains. Ganglion cyst
formation along the PTFL.

## 2020-09-26 IMAGING — MR MR ANKLE*R* W/O CM
6 series · 40 of 40 positions shown · non-contrast
Comparison: Right foot radiograph [DATE]

CLINICAL DATA: Ankle pain, tendon abnormality suspected, neg xray

EXAM:
MRI OF THE RIGHT ANKLE WITHOUT CONTRAST
TECHNIQUE: Multiplanar, multisequence MR imaging of the ankle was performed. No
intravenous contrast was administered.

[Series 3: T2 fat-sat · axial · 3.0mm · 0.62mm/px · z∈[-26,+95]mm · 8 of 32 slices shown (1 of 2)]
[im 1/32]
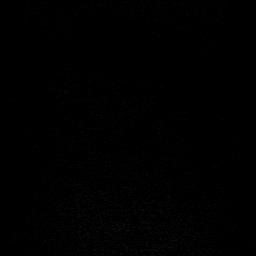
[im 5/32]
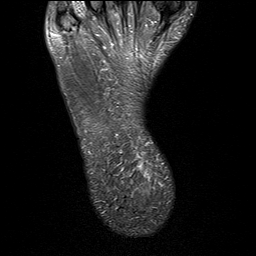
[im 9/32]
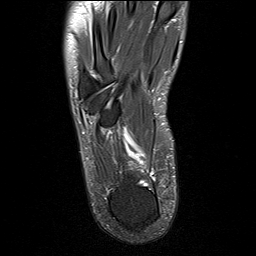
[im 14/32]
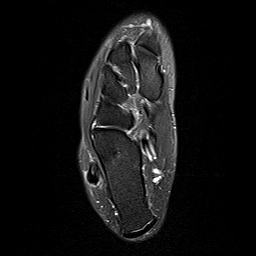
[im 18/32]
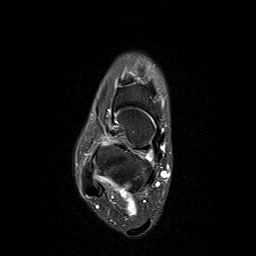
[im 23/32]
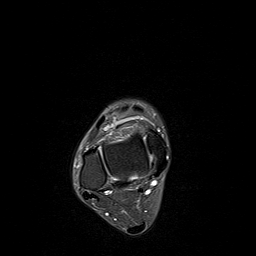
[im 27/32]
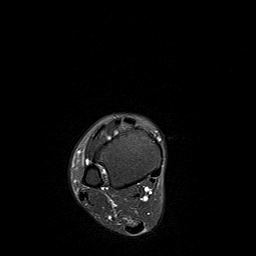
[im 32/32]
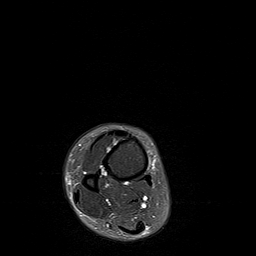

[Series 4: T1 · axial · 3.0mm · 0.62mm/px · z∈[-21,+92]mm · 7 of 30 slices shown (1 of 2)]
[im 1/30]
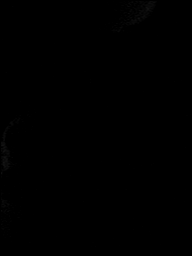
[im 5/30]
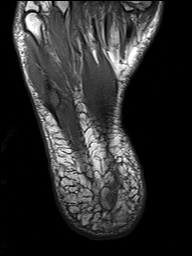
[im 10/30]
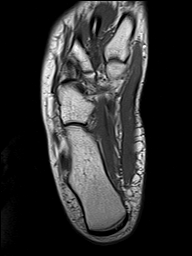
[im 15/30]
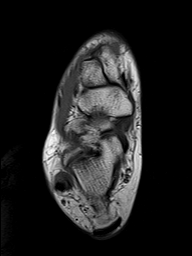
[im 20/30]
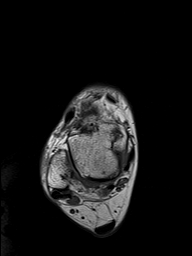
[im 25/30]
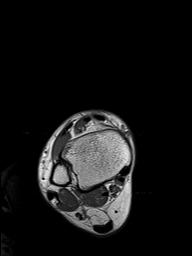
[im 30/30]
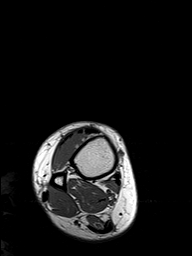

[Series 5: PD fat-sat · axial · 3.0mm · 0.62mm/px · z∈[-26,+95]mm · 7 of 32 slices shown]
[im 1/32]
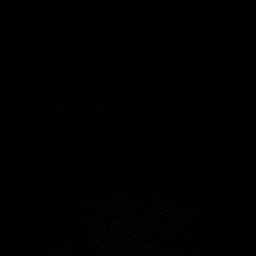
[im 6/32]
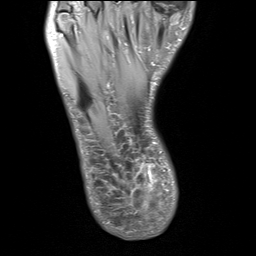
[im 11/32]
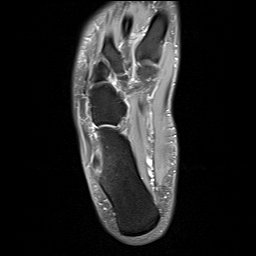
[im 16/32]
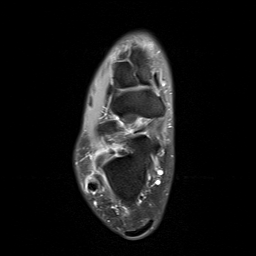
[im 21/32]
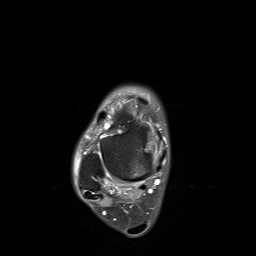
[im 26/32]
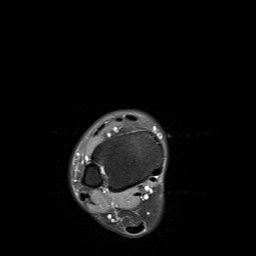
[im 32/32]
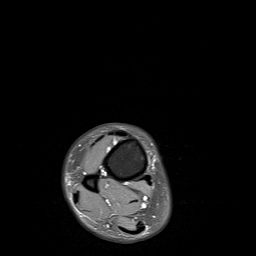

[Series 6: T1 · sagittal · 4.0mm · 0.56mm/px · 5 of 22 slices shown (2 of 2)]
[im 1/22]
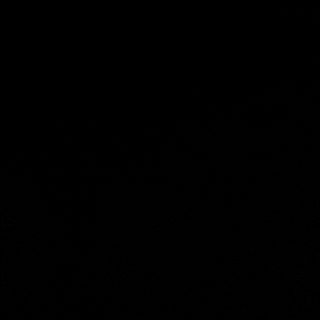
[im 6/22]
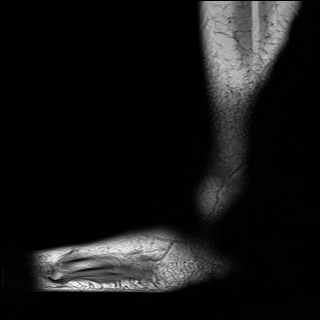
[im 11/22]
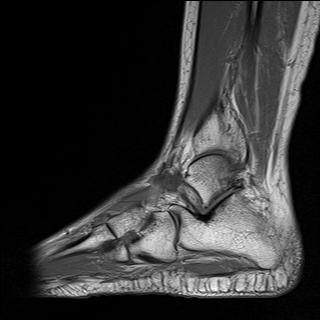
[im 16/22]
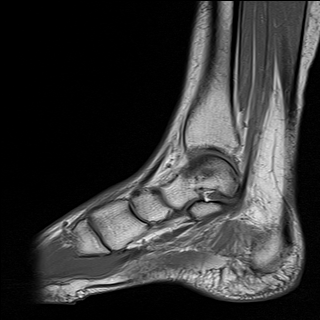
[im 22/22]
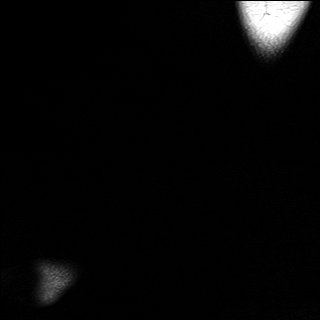

[Series 7: STIR · sagittal · 4.0mm · 0.35mm/px · 5 of 22 slices shown]
[im 1/22]
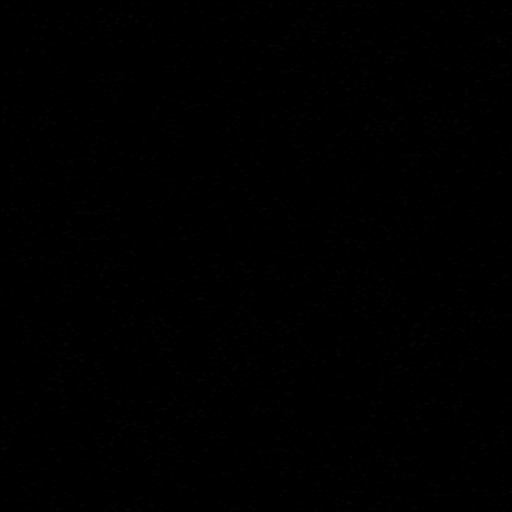
[im 6/22]
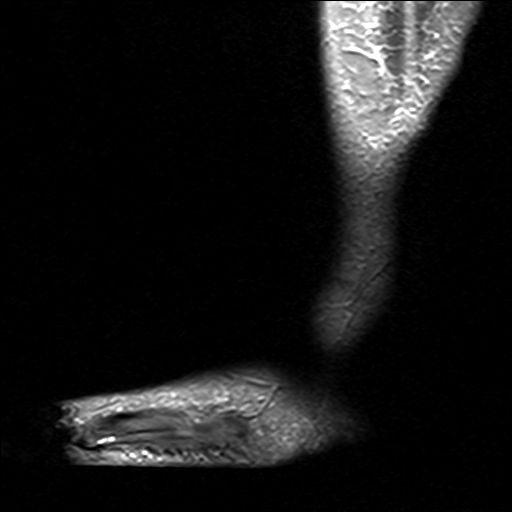
[im 11/22]
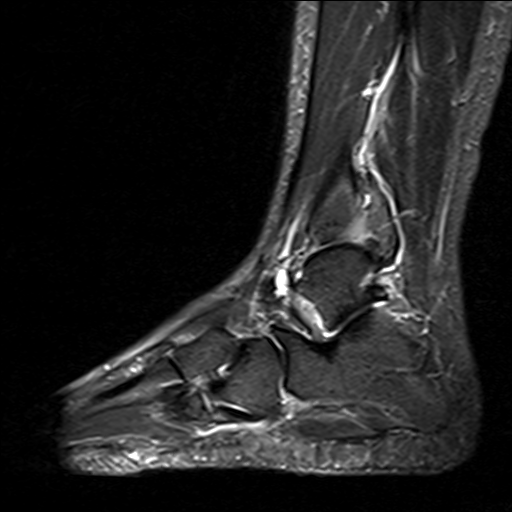
[im 16/22]
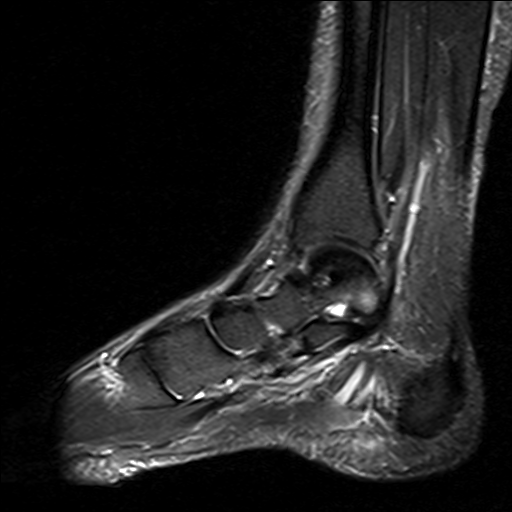
[im 22/22]
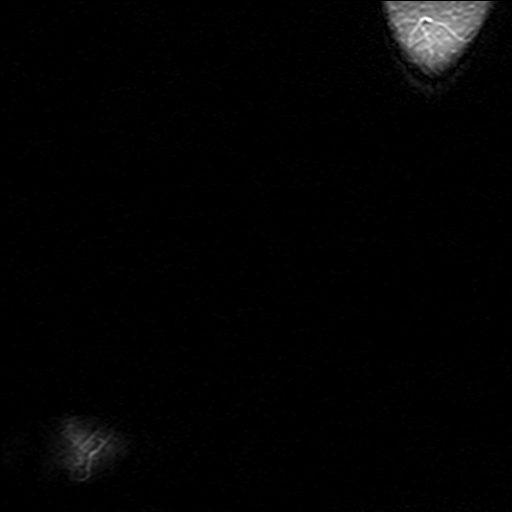

[Series 8: T2 fat-sat · coronal · 3.0mm · 0.62mm/px · 8 of 35 slices shown (2 of 2)]
[im 1/35]
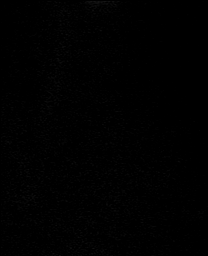
[im 5/35]
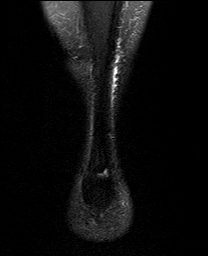
[im 10/35]
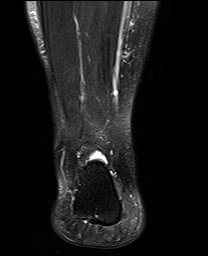
[im 15/35]
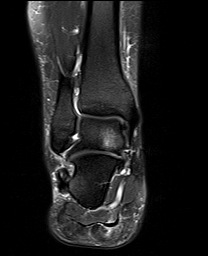
[im 20/35]
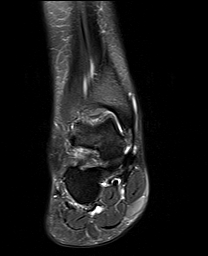
[im 25/35]
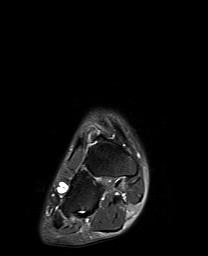
[im 30/35]
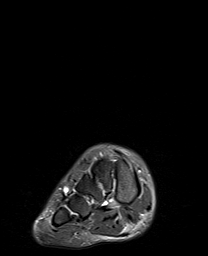
[im 35/35]
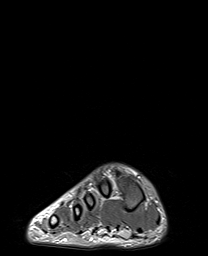

[40 of 40 positions shown; findings below may reference images not displayed]

FINDINGS: TENDONS

Peroneal: Mild peroneal longus tenosynovitis. Split peroneus brevis
tear at and just below the lateral malleolus with continuity
distally.

Posteromedial: Minimal fluid surrounding the posterior tibial tendon
distally near its attachment in the foot. The tendon is otherwise
intact. Flexor hallucis longus tendon intact. Flexor digitorum
longus tendon intact.

Anterior: Tibialis anterior tendon intact. Extensor hallucis longus
tendon intact Extensor digitorum longus tendon intact. There is a
focal ganglion cyst arising from the lateral extensor digitorum
along the undersurface of the tendon measuring 2.5 x 0.8 x 0.5 cm.

Achilles:  Intact.

Plantar Fascia: Intact.

LIGAMENTS

Lateral: There is thickening and intrinsic abnormal signal within
the ATFL suggesting grade 2 sprain. Calcaneofibular ligament intact.
Posterior talofibular ligament intact. Anterior and posterior
tibiofibular ligaments intact.

Medial: Deltoid ligament intact. Spring ligament intact.

CARTILAGE

Ankle Joint: No joint effusion. Normal ankle mortise. No chondral
defect.

Subtalar Joints/Sinus Tarsi: There is bony edema along the posterior
talus at the posterior subtalar joint. Normal sinus tarsi.

Bones: No evidence of acute fracture.  No dislocation.

Soft Tissue: No fluid collection or hematoma. Muscles are normal
without edema or atrophy. Tarsal tunnel is normal.
IMPRESSION: Split peroneus brevis tear at and just below the lateral malleolus
with continuity distally. Mild peroneus longus tenosynovitis.

Findings suggestive of grade 2 ATFL sprain. Intact calcaneofibular
and posterior talofibular ligaments.

Ganglion cyst arising from the lateral extensor digitorum along the
undersurface measuring 2.5 x 0.8 x 0.5 cm.

Focal bony edema along the posterior talus likely related to
posterior subtalar arthritis.

Minimal fluid surrounding the posterior tibial tendon distally near
its attachment suggesting mild tenosynovitis.

## 2020-09-30 ENCOUNTER — Encounter: Payer: Self-pay | Admitting: Podiatry

## 2020-10-19 ENCOUNTER — Ambulatory Visit: Payer: BC Managed Care – PPO | Admitting: Podiatry

## 2020-11-12 ENCOUNTER — Ambulatory Visit (INDEPENDENT_AMBULATORY_CARE_PROVIDER_SITE_OTHER): Payer: BC Managed Care – PPO | Admitting: Podiatry

## 2020-11-12 ENCOUNTER — Other Ambulatory Visit: Payer: Self-pay

## 2020-11-12 DIAGNOSIS — S93402S Sprain of unspecified ligament of left ankle, sequela: Secondary | ICD-10-CM

## 2020-11-12 DIAGNOSIS — M722 Plantar fascial fibromatosis: Secondary | ICD-10-CM

## 2020-11-12 MED ORDER — MELOXICAM 15 MG PO TABS: 15.0000 mg | ORAL_TABLET | Freq: Every day | ORAL | 0 refills | Status: DC

## 2020-11-17 NOTE — Progress Notes (Signed)
Subjective: 40 year old female presents the office today to discuss MRI results.  She previously had right foot pain as well as left ankle pain.  She previously been seen by Dr. Gershon Mussel for this. Denies any systemic complaints such as fevers, chills, nausea, vomiting. No acute changes since last appointment, and no other complaints at this time.   Objective: AAO x3, NAD DP/PT pulses palpable bilaterally, CRT less than 3 seconds On today's exam there is tenderness palpation along the plantar aspect calcaneus insertion plantar fashion the right side as well as on the arch of the foot on the right side.  There is no pain with lateral compression of calcaneus.  There is no edema, erythema.  There is no pain to lateral ankle on the left side of her foot.  No significant soft tissue mass identified.  Mild discomfort along the flexor, posterior tibial tendon the tendon appears to be intact.  On the left side there is tenderness along the lateral ankle ligaments mostly along the ATFL.  Mild increased anterior drawer compared to the contralateral extremity.  No pain with calf compression, swelling, warmth, erythema  Assessment: Right foot plantar fasciitis, left chronic ankle sprain  Plan: -All treatment options discussed with the patient including all alternatives, risks, complications.  -I reviewed the MRI with her.  At this point given her symptoms as well as MRI findings refer to physical therapy.  Referral was placed today.  Discussed using good arch support as well.  At this point recommend hold off on any surgical intervention.  Although the MRI did show partial tears of the peroneal tendon she has no symptoms in this area bilaterally. -Patient encouraged to call the office with any questions, concerns, change in symptoms.   Trula Slade DPM

## 2020-11-23 ENCOUNTER — Other Ambulatory Visit: Payer: Self-pay

## 2020-11-23 ENCOUNTER — Ambulatory Visit: Payer: BC Managed Care – PPO | Attending: Family Medicine | Admitting: Family Medicine

## 2020-12-06 ENCOUNTER — Other Ambulatory Visit: Payer: Self-pay

## 2020-12-06 ENCOUNTER — Encounter: Payer: Self-pay | Admitting: Physical Therapy

## 2020-12-06 ENCOUNTER — Ambulatory Visit: Payer: BC Managed Care – PPO | Attending: Podiatry | Admitting: Physical Therapy

## 2020-12-06 DIAGNOSIS — R262 Difficulty in walking, not elsewhere classified: Secondary | ICD-10-CM

## 2020-12-06 DIAGNOSIS — M25571 Pain in right ankle and joints of right foot: Secondary | ICD-10-CM | POA: Diagnosis not present

## 2020-12-06 DIAGNOSIS — R29898 Other symptoms and signs involving the musculoskeletal system: Secondary | ICD-10-CM | POA: Diagnosis not present

## 2020-12-06 DIAGNOSIS — M25572 Pain in left ankle and joints of left foot: Secondary | ICD-10-CM | POA: Diagnosis not present

## 2020-12-06 DIAGNOSIS — M6281 Muscle weakness (generalized): Secondary | ICD-10-CM

## 2020-12-06 NOTE — Patient Instructions (Signed)
     Access Code: [email or web address] URL: [email or web address] Date: 12/06/2020 Prepared by: Annie Paras  Exercises Gastroc Stretch on Wall - 2-3 x daily - 7 x weekly - 3 reps - 30 sec hold Soleus Stretch on Wall - 2-3 x daily - 7 x weekly - 3 reps - 30 sec hold Gastroc Stretch with Foot at Wall - 2-3 x daily - 7 x weekly - 3 reps - 30 sec hold Plantar Fascia Stretch on Step - 2-3 x daily - 7 x weekly - 3 reps - 30 sec hold Foot Roller Plantar Massage - 2-3 x daily - 7 x weekly - 2-3 min hold
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
## 2020-12-11 DIAGNOSIS — Z20822 Contact with and (suspected) exposure to covid-19: Secondary | ICD-10-CM | POA: Diagnosis not present

## 2020-12-13 ENCOUNTER — Ambulatory Visit: Payer: BC Managed Care – PPO | Admitting: Physical Therapy

## 2020-12-20 ENCOUNTER — Other Ambulatory Visit: Payer: Self-pay

## 2020-12-20 ENCOUNTER — Encounter: Payer: Self-pay | Admitting: Physical Therapy

## 2020-12-20 ENCOUNTER — Ambulatory Visit: Payer: BC Managed Care – PPO | Admitting: Physical Therapy

## 2020-12-20 DIAGNOSIS — M25572 Pain in left ankle and joints of left foot: Secondary | ICD-10-CM | POA: Diagnosis not present

## 2020-12-20 DIAGNOSIS — R262 Difficulty in walking, not elsewhere classified: Secondary | ICD-10-CM

## 2020-12-20 DIAGNOSIS — M25571 Pain in right ankle and joints of right foot: Secondary | ICD-10-CM

## 2020-12-20 DIAGNOSIS — M6281 Muscle weakness (generalized): Secondary | ICD-10-CM

## 2020-12-20 DIAGNOSIS — R29898 Other symptoms and signs involving the musculoskeletal system: Secondary | ICD-10-CM
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
## 2020-12-20 NOTE — Patient Instructions (Addendum)
       Access Code: [email or web address] URL: [email or web address] Date: 12/20/2020 Prepared by: Annie Paras  Exercises Gastroc Stretch on Wall - 2-3 x daily - 7 x weekly - 3 reps - 30 sec hold Soleus Stretch on Wall - 2-3 x daily - 7 x weekly - 3 reps - 30 sec hold Gastroc Stretch with Foot at Wall - 2-3 x daily - 7 x weekly - 3 reps - 30 sec hold Plantar Fascia Stretch on Step - 2-3 x daily - 7 x weekly - 3 reps - 30 sec hold Foot Roller Plantar Massage - 2-3 x daily - 7 x weekly - 2-3 min hold Seated Ankle Plantar Flexion with Resistance Loop - 1 x daily - 7 x weekly - 2 sets - 10 reps - 3 sec hold Seated Ankle Dorsiflexion with Resistance - 1 x daily - 7 x weekly - 2 sets - 10 reps - 3 sec hold Seated Ankle Eversion with Resistance - 1 x daily - 7 x weekly - 2 sets - 10 reps - 3 sec hold Seated Ankle Inversion with Resistance - 1 x daily - 7 x weekly - 2 sets - 10 reps - 3 sec hold Toe Flexion with Resistance - 1 x daily - 7 x weekly - 2 sets - 10 reps - 3 sec hold Seated Toe Towel Scrunches - 2 x daily - 7 x weekly - 2 sets - 15 reps - 3 sec hold Seated Great Toe Extension - 2 x daily - 7 x weekly - 2 sets - 10 reps - 3 sec hold Seated Lesser Toes Extension - 2 x daily - 7 x weekly - 2 sets - 10 reps - 3 sec hold  Patient Education Kinesiology tape    Kinesiology tape  What is kinesiology tape?  There are many brands of kinesiology tape. KTape, [employer], [employer], Dynamic tape, to name a few.  It is an elasticized tape designed to support the body's natural healing process. This tape provides stability and support to muscles and joints without restricting motion.  It can also help decrease swelling in the area of application.  How does it work?  The tape microscopically lifts and decompresses the skin to allow for drainage of lymph (swelling) to flow away from area, reducing inflammation. The tape has the ability to help re-educate the neuromuscular system by  targeting specific receptors in the skin. The presence of the tape increases the body's awareness of posture and body mechanics.  Do not use with:  Open wounds Skin lesions Adhesive allergies  In some rare cases, mild/moderate skin irritation can occur. This can include redness, itchiness, or hives. If this occurs, immediately remove tape and consult your primary care physician if symptoms are severe or do not resolve within 2 days.  Safe removal of the tape:  To remove tape safely, hold nearby skin with one hand and gentle roll tape down with other hand. You can apply oil or conditioner to tape while in shower prior to removal to loosen adhesive. DO NOT swiftly rip tape off like a band-aid, as this could cause skin tears and additional skin irritation.     For questions, please contact your therapist at:  [facility] [address]  [place], [place], [address] Phone: [phone]   Fax:  [phone]

## 2020-12-27 ENCOUNTER — Other Ambulatory Visit: Payer: Self-pay

## 2020-12-27 ENCOUNTER — Ambulatory Visit: Payer: BC Managed Care – PPO | Admitting: Physical Therapy

## 2020-12-27 DIAGNOSIS — M6281 Muscle weakness (generalized): Secondary | ICD-10-CM | POA: Diagnosis not present

## 2020-12-27 DIAGNOSIS — R262 Difficulty in walking, not elsewhere classified: Secondary | ICD-10-CM

## 2020-12-27 DIAGNOSIS — M25571 Pain in right ankle and joints of right foot: Secondary | ICD-10-CM

## 2020-12-27 DIAGNOSIS — R29898 Other symptoms and signs involving the musculoskeletal system: Secondary | ICD-10-CM | POA: Diagnosis not present

## 2020-12-27 DIAGNOSIS — M25572 Pain in left ankle and joints of left foot: Secondary | ICD-10-CM
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
## 2020-12-27 NOTE — Patient Instructions (Signed)
    Access Code: [email or web address] URL: [email or web address] Date: 12/27/2020 Prepared by: Annie Paras  Exercises Gastroc Stretch on Wall - 2-3 x daily - 7 x weekly - 3 reps - 30 sec hold Soleus Stretch on Wall - 2-3 x daily - 7 x weekly - 3 reps - 30 sec hold Gastroc Stretch with Foot at Wall - 2-3 x daily - 7 x weekly - 3 reps - 30 sec hold Plantar Fascia Stretch on Step - 2-3 x daily - 7 x weekly - 3 reps - 30 sec hold Foot Roller Plantar Massage - 2-3 x daily - 7 x weekly - 2-3 min hold Seated Ankle Plantar Flexion with Resistance Loop - 1 x daily - 7 x weekly - 2 sets - 10 reps - 3 sec hold Seated Ankle Dorsiflexion with Resistance - 1 x daily - 7 x weekly - 2 sets - 10 reps - 3 sec hold Seated Ankle Eversion with Resistance - 1 x daily - 7 x weekly - 2 sets - 10 reps - 3 sec hold Seated Ankle Inversion with Resistance - 1 x daily - 7 x weekly - 2 sets - 10 reps - 3 sec hold Toe Flexion with Resistance - 1 x daily - 7 x weekly - 2 sets - 10 reps - 3 sec hold Seated Toe Towel Scrunches - 2 x daily - 7 x weekly - 2 sets - 15 reps - 3 sec hold Seated Great Toe Extension - 2 x daily - 7 x weekly - 2 sets - 10 reps - 3 sec hold Seated Lesser Toes Extension - 2 x daily - 7 x weekly - 2 sets - 10 reps - 3 sec hold Supine Bridge with Resistance Band - 1 x daily - 7 x weekly - 2 sets - 10 reps - 5 sec hold Clamshell with Resistance - 1 x daily - 7 x weekly - 2 sets - 10 reps - 3-5 sec hold Sidelying Reverse Clamshell with Resistance - 1 x daily - 7 x weekly - 2 sets - 10 reps - 3-5 sec hold  Patient Education Kinesiology tape

## 2020-12-30 ENCOUNTER — Ambulatory Visit: Payer: BC Managed Care – PPO | Admitting: Podiatry

## 2021-01-03 ENCOUNTER — Encounter: Payer: BC Managed Care – PPO | Admitting: Physical Therapy

## 2021-01-11 ENCOUNTER — Other Ambulatory Visit: Payer: Self-pay

## 2021-01-11 ENCOUNTER — Ambulatory Visit: Payer: BC Managed Care – PPO | Attending: Podiatry | Admitting: Physical Therapy

## 2021-01-11 DIAGNOSIS — M6281 Muscle weakness (generalized): Secondary | ICD-10-CM | POA: Insufficient documentation

## 2021-01-11 DIAGNOSIS — R29898 Other symptoms and signs involving the musculoskeletal system: Secondary | ICD-10-CM | POA: Insufficient documentation

## 2021-01-11 DIAGNOSIS — M25571 Pain in right ankle and joints of right foot: Secondary | ICD-10-CM | POA: Diagnosis not present

## 2021-01-11 DIAGNOSIS — R262 Difficulty in walking, not elsewhere classified: Secondary | ICD-10-CM | POA: Insufficient documentation

## 2021-01-11 DIAGNOSIS — M25572 Pain in left ankle and joints of left foot: Secondary | ICD-10-CM | POA: Diagnosis not present
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
## 2021-01-17 ENCOUNTER — Ambulatory Visit: Payer: BC Managed Care – PPO | Admitting: Physical Therapy

## 2021-05-14 ENCOUNTER — Emergency Department (HOSPITAL_COMMUNITY)
Admission: EM | Admit: 2021-05-14 | Discharge: 2021-05-14 | Disposition: A | Discharge status: Home / Self Care | Payer: BC Managed Care – PPO | Attending: Emergency Medicine | Admitting: Emergency Medicine

## 2021-05-14 ENCOUNTER — Encounter (HOSPITAL_COMMUNITY): Payer: Self-pay | Admitting: *Deleted

## 2021-05-14 ENCOUNTER — Encounter: Payer: Self-pay | Admitting: Family Medicine

## 2021-05-14 ENCOUNTER — Other Ambulatory Visit: Payer: Self-pay

## 2021-05-14 DIAGNOSIS — B009 Herpesviral infection, unspecified: Secondary | ICD-10-CM | POA: Diagnosis not present

## 2021-05-14 DIAGNOSIS — R21 Rash and other nonspecific skin eruption: Secondary | ICD-10-CM

## 2021-05-14 DIAGNOSIS — A6004 Herpesviral vulvovaginitis: Secondary | ICD-10-CM

## 2021-05-14 DIAGNOSIS — N760 Acute vaginitis: Secondary | ICD-10-CM | POA: Diagnosis not present

## 2021-05-14 LAB — WET PREP, GENITAL
Sperm: NONE SEEN
Trich, Wet Prep: NONE SEEN
WBC, Wet Prep HPF POC: >=10 — AB (ref <10)
Yeast Wet Prep HPF POC: NONE SEEN

## 2021-05-14 LAB — RAPID HIV SCREEN (HIV 1/2 AB+AG)
HIV 1/2 Antibodies: NONREACTIVE
HIV-1 P24 Antigen - HIV24: NONREACTIVE

## 2021-05-14 LAB — RPR: RPR Ser Ql: NONREACTIVE

## 2021-05-14 MED ORDER — VALACYCLOVIR HCL 1 G PO TABS: 1000.0000 mg | ORAL_TABLET | Freq: Two times a day (BID) | ORAL | 0 refills | Status: DC

## 2021-05-14 MED ORDER — IBUPROFEN 600 MG PO TABS: 600.0000 mg | ORAL_TABLET | Freq: Four times a day (QID) | ORAL | 0 refills | Status: DC | PRN

## 2021-05-14 NOTE — Discharge Instructions (Signed)
You have nonspecific rash over your vaginal area.  We suspect at this time that most likely this is herpes infection and are starting you on antiviral medications. please start taking the antiviral medications immediately. Please set up an appointment with your primary care doctor in 7 to 10 days. Refrain from sexual intercourse.

## 2021-05-14 NOTE — ED Provider Notes (Signed)
[facility] EMERGENCY DEPARTMENT Provider Note   CSN: [id number] Arrival date & time: 05/14/21  0153     History  Chief Complaint  Patient presents with   Rash    [name] [name] is a 41 y.o. female.  HPI     41 year old female comes in with chief complaint of rash.  Patient indicates that yesterday she started noticing some pain over the vaginal area.  Thereafter, she started having bumps that have spread.  She has no vaginal discharge, UTI-like symptoms.  No history of herpes or any STD and patient is in a monogamous relationship over extended period of time.  Home Medications Prior to Admission medications   Medication Sig Start Date End Date Taking? Authorizing Provider  ibuprofen (IBU) 600 MG tablet Take 1 tablet (600 mg total) by mouth every 6 (six) hours as needed. 05/14/21  Yes Varney Biles, MD  valACYclovir (VALTREX) 1000 MG tablet Take 1 tablet (1,000 mg total) by mouth 2 (two) times daily for 10 days. 05/14/21 05/24/21 Yes Varney Biles, MD  FLUoxetine (PROZAC) 20 MG capsule Take 1 capsule (20 mg total) by mouth daily. 08/13/20   Charlott Rakes, MD  meloxicam (MOBIC) 15 MG tablet Take 1 tablet (15 mg total) by mouth daily. Patient not taking: Reported on 12/06/2020 11/12/20 11/12/21  Trula Slade, DPM      Allergies    Lexapro [escitalopram oxalate]    Review of Systems   Review of Systems  Constitutional:  Positive for activity change.  Skin:  Positive for rash.   Physical Exam Updated Vital Signs BP 115/66    Pulse 86    Temp 99 F (37.2 C) (Oral)    Resp (!) 24    Ht 5\' 6"  (1.676 m)    Wt 84.3 kg    LMP 04/22/2021    SpO2 99%    BMI 30.00 kg/m  Physical Exam Vitals and nursing note reviewed.  Constitutional:      Appearance: She is well-developed.  HENT:     Head: Normocephalic and atraumatic.  Eyes:     Conjunctiva/sclera: Conjunctivae normal.     Pupils: Pupils are equal, round, and reactive to light.  Pulmonary:     Effort:  Pulmonary effort is normal.  Abdominal:     General: Bowel sounds are normal. There is no distension.     Palpations: Abdomen is soft.     Tenderness: There is no abdominal tenderness.  Genitourinary:    Vagina: Normal.     Comments: External exam - normal, multiple raised, erythematous macules with scatters pustule within them. Speculum exam: Pt has some white discharge, no blood  Skin:    General: Skin is warm and dry.  Neurological:     Mental Status: She is alert and oriented to person, place, and time.    ED Results / Procedures / Treatments   Labs (all labs ordered are listed, but only abnormal results are displayed) Labs Reviewed  WET PREP, GENITAL - Abnormal; Notable for the following components:      Result Value   Clue Cells Wet Prep HPF POC PRESENT (*)    WBC, Wet Prep HPF POC >=10 (*)    All other components within normal limits  HERPES SIMPLEX VIRUS(HSV) DNA BY PCR  RPR  RAPID HIV SCREEN (HIV 1/2 AB+AG)  GC/CHLAMYDIA PROBE AMP ([place]) NOT AT [facility]    EKG None  Radiology No results found.  Procedures Procedures    Medications Ordered in  ED Medications - No data to display  ED Course/ Medical Decision Making/ A&P Clinical Course as of 05/14/21 0954  Sat May 14, 2021  2620 Wet prep is negative. Positive clue cells, but I doubt that she is having symptomatic bacterial vaginosis.  Will not treat [AN]    Clinical Course User Index [AN] Varney Biles, MD                           Medical Decision Making Patient comes in with chief complaint of rash to the vulvar region into 1 day. The area is tender to palpation.  On exam, there is diffuse macular, erythematous lesions/bumps over the vulvar region.  2 or 3 scattered pustular lesions found amongst the macular lesions.  Clinical concern for herpes breakout.  Herpes swab sent along with RPR, HIV, GC chlamydia and wet prep.  Patient informed that our clinical concern for herpes is high and we  will start her on medications for it.  She will have to follow-up with her PCP for further evaluation and management, and she is in agreement with the plan.  Amount and/or Complexity of Data Reviewed Labs: ordered. Decision-making details documented in ED Course.           Final Clinical Impression(s) / ED Diagnoses Final diagnoses:  Rash and nonspecific skin eruption  Herpes simplex vulvovaginitis    Rx / DC Orders ED Discharge Orders          Ordered    valACYclovir (VALTREX) 1000 MG tablet  2 times daily        05/14/21 0952    ibuprofen (IBU) 600 MG tablet  Every 6 hours PRN        05/14/21 3559              Varney Biles, MD 05/14/21 [phone]

## 2021-05-14 NOTE — ED Triage Notes (Signed)
The pt is here with a  rash on her  vagina since this am  burning pain and irritated,  lmp dec 16th

## 2021-05-15 ENCOUNTER — Telehealth (HOSPITAL_COMMUNITY): Payer: Self-pay | Admitting: Emergency Medicine

## 2021-05-15 MED ORDER — CEPHALEXIN 500 MG PO CAPS: 500.0000 mg | ORAL_CAPSULE | Freq: Four times a day (QID) | ORAL | 0 refills | Status: DC

## 2021-05-15 NOTE — Telephone Encounter (Signed)
During review of my chart, I noticed that I did not send Keflex that patient had requested at the end of our encounter, when I was going over the discharge instructions.  She had indicated that she does shave in the area as well and, and given that she is in a monogamous relationship she had a lower concern for herpes.  We decided to treat her with antimicrobial agent as well for presumed cellulitis.

## 2021-05-16 LAB — GC/CHLAMYDIA PROBE AMP (~~LOC~~) NOT AT ARMC
Chlamydia: NEGATIVE
Comment: NEGATIVE
Comment: NORMAL
Neisseria Gonorrhea: NEGATIVE

## 2021-05-17 ENCOUNTER — Other Ambulatory Visit: Payer: Self-pay

## 2021-05-17 ENCOUNTER — Ambulatory Visit: Payer: BC Managed Care – PPO | Admitting: Physician Assistant

## 2021-05-17 VITALS — BP 138/90 | HR 84 | Temp 98.2°F | Resp 18 | Ht 62.0 in | Wt 181.0 lb

## 2021-05-17 DIAGNOSIS — B9689 Other specified bacterial agents as the cause of diseases classified elsewhere: Secondary | ICD-10-CM

## 2021-05-17 DIAGNOSIS — N760 Acute vaginitis: Secondary | ICD-10-CM | POA: Diagnosis not present

## 2021-05-17 DIAGNOSIS — B009 Herpesviral infection, unspecified: Secondary | ICD-10-CM | POA: Diagnosis not present

## 2021-05-17 DIAGNOSIS — Z6833 Body mass index (BMI) 33.0-33.9, adult: Secondary | ICD-10-CM

## 2021-05-17 DIAGNOSIS — E6609 Other obesity due to excess calories: Secondary | ICD-10-CM

## 2021-05-17 LAB — HSV DNA BY PCR (REFERENCE LAB)
HSV 1 DNA: NEGATIVE
HSV 2 DNA: POSITIVE — AB

## 2021-05-17 MED ORDER — METRONIDAZOLE 500 MG PO TABS
500.0000 mg | ORAL_TABLET | Freq: Two times a day (BID) | ORAL | 0 refills | Status: AC
  Filled 2021-05-17: qty 14, 7d supply, fill #0

## 2021-05-17 MED ORDER — VALACYCLOVIR HCL 1 G PO TABS
1000.0000 mg | ORAL_TABLET | Freq: Two times a day (BID) | ORAL | 0 refills | Status: AC
  Filled 2021-05-17: qty 20, 10d supply, fill #0

## 2021-05-17 NOTE — Progress Notes (Signed)
Patient reports needing assistance with medications prescribed from ED visit. Patient denies pain at this time.

## 2021-05-17 NOTE — Patient Instructions (Signed)
You will take acyclovir twice a day for the next 10 days.  You will also take metronidazole twice a day for the next 7 days.  Kennieth Rad, PA-C Physician Assistant [facility] Medicine [email or web address]   Genital Herpes Genital herpes is a common sexually transmitted infection (STI) that is caused by a virus. The virus spreads from person to person through sexual contact. The infection can cause itching, blisters, and sores around the genitals or rectum. Symptoms may last for several days and then go away. This is called an outbreak. The virus remains in the body, however, so more outbreaks may happen in the future. The time between outbreaks varies and can be from months to years. Genital herpes can affect anyone. It is particularly concerning for pregnant women because the virus can be passed to the baby during delivery and can cause serious problems. Genital herpes is also a concern for people who have a weak disease-fighting system (immune system). What are the causes? This condition is caused by the human herpesvirus, also called herpes simplex virus, type 1 or type 2 (HSV-1 or HSV-2). The virus may spread through: Sexual contact with an infected person, including vaginal, anal, and oral sex. Contact with fluid from a herpes sore. The skin. This means that you can get herpes from an infected partner even if there are no blisters or sores present. Your partner may not know that he or she is infected. What increases the risk? You are more likely to develop this condition if: You have sex with many partners. You do not use latex condoms during sex. What are the signs or symptoms? Most people do not have symptoms (are asymptomatic), or they have mild symptoms that may be mistaken for other skin problems. Symptoms may include: Small, red bumps near the genitals, rectum, or mouth. These bumps turn into blisters and then sores. Flu-like  symptoms, including: Fever. Body aches. Swollen lymph nodes. Headache. Painful urination. Pain and itching in the genital area or rectal area. Vaginal discharge. Tingling or shooting pain in the legs and buttocks. Generally, symptoms are more severe and last longer during the first (primary) outbreak. Flu-like symptoms are also more common during the primary outbreak. How is this diagnosed? This condition may be diagnosed based on: A physical exam. Your medical history. Blood tests. A test of a fluid sample (culture) from an open sore. How is this treated? There is no cure for this condition, but treatment with antiviral medicines that are taken by mouth (orally) can do the following: Speed up healing and relieve symptoms. Help to reduce the spread of the virus to sexual partners. Limit the chance of future outbreaks, or make future outbreaks shorter. Lessen symptoms of future outbreaks. Your health care provider may also recommend pain relief medicines, such as aspirin or ibuprofen. Follow these instructions at home: If you have an outbreak: Keep the affected areas dry and clean. Avoid rubbing or touching blisters and sores. If you do touch blisters or sores: Wash your hands thoroughly with soap and water. Do not touch your eyes afterward. To help relieve pain or itching, you may take the following actions as told by your health care provider: Apply a cold, wet cloth (cold compress) to affected areas 4-6 times a day. Apply a substance that protects your skin and reduces bleeding (astringent). Apply a gel that helps relieve pain around sores (lidocaine gel). Take a warm, shallow bath that cleans the genital area (sitz bath). Sexual activity Do not have  sexual contact during active outbreaks. Practice safe sex. Herpes can spread even if your partner does not have blisters or sores. Latex condoms and female condoms may help prevent the spread of the herpes virus. General  instructions Take over-the-counter and prescription medicines only as told by your health care provider. Keep all follow-up visits as told by your health care provider. This is important. How is this prevented? Use condoms. Although you can get genital herpes during sexual contact even with the use of a condom, a condom can provide some protection. Avoid having multiple sexual partners. Talk with your sexual partner about any symptoms either of you may have. Also, talk with your partner about any history of STIs. Get tested for STIs before you have sex. Ask your partner to do the same. Do not have sexual contact if you have active symptoms of genital herpes. Contact a health care provider if: Your symptoms are not improving with medicine. Your symptoms return, or you have new symptoms. You have a fever. You have abdominal pain. You have redness, swelling, or pain in your eye. You notice new sores on other parts of your body. You are a woman and you experience bleeding between menstrual periods. You have had herpes and you become pregnant or plan to become pregnant. Summary Genital herpes is a common sexually transmitted infection (STI) that is caused by the herpes simplex virus, type 1 or type 2 (HSV-1 or HSV-2). These viruses are most often spread through sexual contact with an infected person. You are more likely to develop this condition if you have sex with many partners or you do not use condoms during sex. Most people do not have symptoms (are asymptomatic) or have mild symptoms that may be mistaken for other skin problems. Symptoms occur as outbreaks that may happen months or years apart. There is no cure for this condition, but treatment with oral antiviral medicines can reduce symptoms, reduce the chance of spreading the virus to a partner, prevent future outbreaks, or shorten future outbreaks. This information is not intended to replace advice given to you by your health care  provider. Make sure you discuss any questions you have with your health care provider. Document Revised: 01/03/2021 Document Reviewed: 01/02/2019 Elsevier Patient Education  [place].
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
## 2021-05-18 ENCOUNTER — Encounter: Payer: Self-pay | Admitting: Physician Assistant

## 2021-05-18 DIAGNOSIS — B009 Herpesviral infection, unspecified: Secondary | ICD-10-CM | POA: Insufficient documentation

## 2021-05-24 ENCOUNTER — Encounter: Payer: Self-pay | Admitting: Physician Assistant

## 2021-06-02 ENCOUNTER — Other Ambulatory Visit: Payer: Self-pay

## 2021-06-02 ENCOUNTER — Ambulatory Visit: Payer: BC Managed Care – PPO | Admitting: Physician Assistant

## 2021-06-02 VITALS — BP 118/69 | HR 86 | Temp 98.2°F | Resp 18 | Ht 65.0 in | Wt 180.0 lb

## 2021-06-02 DIAGNOSIS — F5104 Psychophysiologic insomnia: Secondary | ICD-10-CM

## 2021-06-02 DIAGNOSIS — B009 Herpesviral infection, unspecified: Secondary | ICD-10-CM

## 2021-06-02 DIAGNOSIS — F439 Reaction to severe stress, unspecified: Secondary | ICD-10-CM

## 2021-06-02 MED ORDER — VALACYCLOVIR HCL 500 MG PO TABS
500.0000 mg | ORAL_TABLET | Freq: Two times a day (BID) | ORAL | 2 refills | Status: DC
  Filled 2021-06-02: qty 10, 5d supply, fill #0
  Filled 2021-06-16: qty 10, 5d supply, fill #1
  Filled 2021-07-07: qty 10, 5d supply, fill #2

## 2021-06-02 NOTE — Patient Instructions (Addendum)
I do encourage you to start using melatonin to help you with your difficulty falling asleep.  You will purchase this over-the-counter.  If your stress levels continue to remain elevated, please feel free to follow-up with Dr. Margarita Rana or return to the mobile unit as needed.  Please let us know if there is anything else we can do for you  Kennieth Rad, PA-C Physician Assistant [facility] [email or web address]   Managing Stress, Adult Feeling a certain amount of stress is normal. Stress helps our body and mind get ready to deal with the demands of life. Stress hormones can motivate you to do well at work and meet your responsibilities. But severe or long-term (chronic) stress can affect your mental and physical health. Chronic stress puts you at higher risk for: Anxiety and depression. Other health problems such as digestive problems, muscle aches, heart disease, high blood pressure, and stroke. What are the causes? Common causes of stress include: Demands from work, such as deadlines, feeling overworked, or having long hours. Pressures at home, such as money issues, disagreements with a spouse, or parenting issues. Pressures from major life changes, such as divorce, moving, loss of a loved one, or chronic illness. You may be at higher risk for stress-related problems if you: Do not get enough sleep. Are in poor health. Do not have emotional support. Have a mental health disorder such as anxiety or depression. How to recognize stress Stress can make you: Have trouble sleeping. Feel sad, anxious, irritable, or overwhelmed. Lose your appetite. Overeat or want to eat unhealthy foods. Want to use drugs or alcohol. Stress can also cause physical symptoms, such as: Sore, tense muscles, especially in the shoulders and neck. Headaches. Trouble breathing. A faster heart rate. Stomach pain, nausea, or vomiting. Diarrhea or  constipation. Trouble concentrating. Follow these instructions at home: Eating and drinking Eat a healthy diet. This includes: Eating foods that are high in fiber, such as beans, whole grains, and fresh fruits and vegetables. Limiting foods that are high in fat and processed sugars, such as fried or sweet foods. Do not skip meals or overeat. Drink enough fluid to keep your urine pale yellow. Alcohol use Do not drink alcohol if: Your health care provider tells you not to drink. You are pregnant, may be pregnant, or are planning to become pregnant. Drinking alcohol is a way some people try to ease their stress. This can be dangerous, so if you drink alcohol: Limit how much you have to: 0-1 drink a day for women. 0-2 drinks a day for men. Know how much alcohol is in your drink. In the [place] one drink equals one 12 oz bottle of beer (355 mL), one 5 oz glass of wine (148 mL), or one 1 oz glass of hard liquor (44 mL). Activity  Include 30 minutes of exercise in your daily schedule. Exercise is a good stress reducer. Include time in your day for an activity that you find relaxing. Try taking a walk, going on a bike ride, reading a book, or listening to music. Schedule your time in a way that lowers stress, and keep a regular schedule. Focus on doing what is most important to get done. Lifestyle Identify the source of your stress and your reaction to it. See a therapist who can help you change unhelpful reactions. When there are stressful events: Talk about them with family, friends, or coworkers. Try to think realistically about stressful events and not ignore them or overreact. Try to find  the positives in a stressful situation and not focus on the negatives. Cut back on responsibilities at work and home, if possible. Ask for help from friends or family members if you need it. Find ways to manage stress, such as: Mindfulness, meditation, or deep breathing. Yoga or tai chi. Progressive  muscle relaxation. Spending time in nature. Doing art, playing music, or reading. Making time for fun activities. Spending time with family and friends. Get support from family, friends, or spiritual resources. General instructions Get enough sleep. Try to go to sleep and get up at about the same time every day. Take over-the-counter and prescription medicines only as told by your health care provider. Do not use any products that contain nicotine or tobacco. These products include cigarettes, chewing tobacco, and vaping devices, such as e-cigarettes. If you need help quitting, ask your health care provider. Do not use drugs or smoke to deal with stress. Keep all follow-up visits. This is important. Where to find support Talk with your health care provider about stress management or finding a support group. Find a therapist to work with you on your stress management techniques. Where to find more information [employer] on Mental Illness: www.nami.org American Psychological Association: TVStere[employer].ch Contact a health care provider if: Your stress symptoms get worse. You are unable to manage your stress at home. You are struggling to stop using drugs or alcohol. Get help right away if: You may be a danger to yourself or others. You have any thoughts of death or suicide. Get help right awayif you feel like you may hurt yourself or others, or have thoughts about taking your own life. Go to your nearest emergency room or: Call 911. Call the [facility] at 817-676-3437 or 988 in the [place] This is open 24 hours a day. Text the Crisis Text Line at 6474704502. Summary Feeling a certain amount of stress is normal, but severe or long-term (chronic) stress can affect your mental and physical health. Chronic stress can put you at higher risk for anxiety, depression, and other health problems such as digestive problems, muscle aches, heart disease, high blood pressure,  and stroke. You may be at higher risk for stress-related problems if you do not get enough sleep, are in poor health, lack emotional support, or have a mental health disorder such as anxiety or depression. Identify the source of your stress and your reaction to it. Try talking about stressful events with family, friends, or coworkers, finding a coping method, or getting support from spiritual resources. If you need more help, talk with your health care provider about finding a support group or a mental health therapist. This information is not intended to replace advice given to you by your health care provider. Make sure you discuss any questions you have with your health care provider. Document Revised: 11/18/2020 Document Reviewed: 11/16/2020 Elsevier Patient Education  2022 [employer].  Insomnia Insomnia is a sleep disorder that makes it difficult to fall asleep or stay asleep. Insomnia can cause fatigue, low energy, difficulty concentrating, mood swings, and poor performance at work or school. There are three different ways to classify insomnia: Difficulty falling asleep. Difficulty staying asleep. Waking up too early in the morning. Any type of insomnia can be long-term (chronic) or short-term (acute). Both are common. Short-term insomnia usually lasts for three months or less. Chronic insomnia occurs at least three times a week for longer than three months. What are the causes? Insomnia may be caused by another condition,  situation, or substance, such as: Anxiety. Certain medicines. Gastroesophageal reflux disease (GERD) or other gastrointestinal conditions. Asthma or other breathing conditions. Restless legs syndrome, sleep apnea, or other sleep disorders. Chronic pain. Menopause. Stroke. Abuse of alcohol, tobacco, or illegal drugs. Mental health conditions, such as depression. Caffeine. Neurological disorders, such as Alzheimer's disease. An overactive thyroid  (hyperthyroidism). Sometimes, the cause of insomnia may not be known. What increases the risk? Risk factors for insomnia include: Gender. Women are affected more often than men. Age. Insomnia is more common as you get older. Stress. Lack of exercise. Irregular work schedule or working night shifts. Traveling between different time zones. Certain medical and mental health conditions. What are the signs or symptoms? If you have insomnia, the main symptom is having trouble falling asleep or having trouble staying asleep. This may lead to other symptoms, such as: Feeling fatigued or having low energy. Feeling nervous about going to sleep. Not feeling rested in the morning. Having trouble concentrating. Feeling irritable, anxious, or depressed. How is this diagnosed? This condition may be diagnosed based on: Your symptoms and medical history. Your health care provider may ask about: Your sleep habits. Any medical conditions you have. Your mental health. A physical exam. How is this treated? Treatment for insomnia depends on the cause. Treatment may focus on treating an underlying condition that is causing insomnia. Treatment may also include: Medicines to help you sleep. Counseling or therapy. Lifestyle adjustments to help you sleep better. Follow these instructions at home: Eating and drinking  Limit or avoid alcohol, caffeinated beverages, and cigarettes, especially close to bedtime. These can disrupt your sleep. Do not eat a large meal or eat spicy foods right before bedtime. This can lead to digestive discomfort that can make it hard for you to sleep. Sleep habits  Keep a sleep diary to help you and your health care provider figure out what could be causing your insomnia. Write down: When you sleep. When you wake up during the night. How well you sleep. How rested you feel the next day. Any side effects of medicines you are taking. What you eat and drink. Make your bedroom  a dark, comfortable place where it is easy to fall asleep. Put up shades or blackout curtains to block light from outside. Use a white noise machine to block noise. Keep the temperature cool. Limit screen use before bedtime. This includes: Watching TV. Using your smartphone, tablet, or computer. Stick to a routine that includes going to bed and waking up at the same times every day and night. This can help you fall asleep faster. Consider making a quiet activity, such as reading, part of your nighttime routine. Try to avoid taking naps during the day so that you sleep better at night. Get out of bed if you are still awake after 15 minutes of trying to sleep. Keep the lights down, but try reading or doing a quiet activity. When you feel sleepy, go back to bed. General instructions Take over-the-counter and prescription medicines only as told by your health care provider. Exercise regularly, as told by your health care provider. Avoid exercise starting several hours before bedtime. Use relaxation techniques to manage stress. Ask your health care provider to suggest some techniques that may work well for you. These may include: Breathing exercises. Routines to release muscle tension. Visualizing peaceful scenes. Make sure that you drive carefully. Avoid driving if you feel very sleepy. Keep all follow-up visits as told by your health care provider. This is  important. Contact a health care provider if: You are tired throughout the day. You have trouble in your daily routine due to sleepiness. You continue to have sleep problems, or your sleep problems get worse. Get help right away if: You have serious thoughts about hurting yourself or someone else. If you ever feel like you may hurt yourself or others, or have thoughts about taking your own life, get help right away. You can go to your nearest emergency department or call: Your local emergency services (911 in the [place] A suicide crisis  helpline, such as the [facility] at 602 859 6162 or 988 in the [place]. This is open 24 hours a day. Summary Insomnia is a sleep disorder that makes it difficult to fall asleep or stay asleep. Insomnia can be long-term (chronic) or short-term (acute). Treatment for insomnia depends on the cause. Treatment may focus on treating an underlying condition that is causing insomnia. Keep a sleep diary to help you and your health care provider figure out what could be causing your insomnia. This information is not intended to replace advice given to you by your health care provider. Make sure you discuss any questions you have with your health care provider. Document Revised: 11/17/2020 Document Reviewed: 03/04/2020 Elsevier Patient Education  2022 [employer].
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
## 2021-06-02 NOTE — Progress Notes (Signed)
Patient completed courses of medication and reports having a new breakout due to high stress levels.

## 2021-06-16 ENCOUNTER — Encounter: Payer: Self-pay | Admitting: Family Medicine

## 2021-06-16 ENCOUNTER — Other Ambulatory Visit: Payer: Self-pay | Admitting: Family Medicine

## 2021-06-16 ENCOUNTER — Other Ambulatory Visit: Payer: Self-pay

## 2021-06-20 ENCOUNTER — Other Ambulatory Visit: Payer: Self-pay

## 2021-07-07 ENCOUNTER — Other Ambulatory Visit: Payer: Self-pay

## 2021-08-27 ENCOUNTER — Encounter: Payer: Self-pay | Admitting: Family Medicine

## 2021-08-29 ENCOUNTER — Other Ambulatory Visit: Payer: Self-pay

## 2021-08-29 ENCOUNTER — Emergency Department (HOSPITAL_COMMUNITY)
Admission: EM | Admit: 2021-08-29 | Discharge: 2021-08-29 | Discharge status: Against Medical Advice | Payer: BC Managed Care – PPO | Attending: Emergency Medicine | Admitting: Emergency Medicine

## 2021-08-29 ENCOUNTER — Encounter (HOSPITAL_COMMUNITY): Payer: Self-pay | Admitting: Emergency Medicine

## 2021-08-29 DIAGNOSIS — R55 Syncope and collapse: Secondary | ICD-10-CM | POA: Diagnosis not present

## 2021-08-29 DIAGNOSIS — R42 Dizziness and giddiness: Secondary | ICD-10-CM | POA: Diagnosis not present

## 2021-08-29 DIAGNOSIS — Z5321 Procedure and treatment not carried out due to patient leaving prior to being seen by health care provider: Secondary | ICD-10-CM | POA: Diagnosis not present

## 2021-08-29 LAB — CBG MONITORING, ED: Glucose-Capillary: 81 mg/dL (ref 70–99)

## 2021-08-29 LAB — BASIC METABOLIC PANEL
Anion gap: 5 (ref 5–15)
BUN: 9 mg/dL (ref 6–20)
CO2: 26 mmol/L (ref 22–32)
Calcium: 8.9 mg/dL (ref 8.9–10.3)
Chloride: 108 mmol/L (ref 98–111)
Creatinine, Ser: 0.68 mg/dL (ref 0.44–1.00)
GFR, Estimated: >60 mL/min (ref >60)
Glucose, Bld: 99 mg/dL (ref 70–99)
Potassium: 4.7 mmol/L (ref 3.5–5.1)
Sodium: 139 mmol/L (ref 135–145)

## 2021-08-29 LAB — CBC WITH DIFFERENTIAL/PLATELET
Abs Immature Granulocytes: 0.02 10*3/uL (ref 0.00–0.07)
Basophils Absolute: 0.1 10*3/uL (ref 0.0–0.1)
Basophils Relative: 1 %
Eosinophils Absolute: 0.3 10*3/uL (ref 0.0–0.5)
Eosinophils Relative: 4 %
HCT: 41.2 % (ref 36.0–46.0)
Hemoglobin: 13.5 g/dL (ref 12.0–15.0)
Immature Granulocytes: 0 %
Lymphocytes Relative: 22 %
Lymphs Abs: 1.7 10*3/uL (ref 0.7–4.0)
MCH: 30.2 pg (ref 26.0–34.0)
MCHC: 32.8 g/dL (ref 30.0–36.0)
MCV: 92.2 fL (ref 80.0–100.0)
Monocytes Absolute: 0.5 10*3/uL (ref 0.1–1.0)
Monocytes Relative: 7 %
Neutro Abs: 5 10*3/uL (ref 1.7–7.7)
Neutrophils Relative %: 66 %
Platelets: 285 10*3/uL (ref 150–400)
RBC: 4.47 MIL/uL (ref 3.87–5.11)
RDW: 12.5 % (ref 11.5–15.5)
WBC: 7.6 10*3/uL (ref 4.0–10.5)
nRBC: 0 % (ref 0.0–0.2)

## 2021-08-29 LAB — I-STAT BETA HCG BLOOD, ED (MC, WL, AP ONLY): I-stat hCG, quantitative: <5 m[IU]/mL (ref <5)

## 2021-08-29 LAB — TROPONIN I (HIGH SENSITIVITY): Troponin I (High Sensitivity): 3 ng/L (ref <18)

## 2021-08-29 NOTE — ED Triage Notes (Signed)
Patient coming from home, complaint of dizziness, headache, vertigo symptoms for 1 month. States that prior to this had some dizziness every once in a while, now has become an every day occurrence. VSS. NAD. ?

## 2021-08-29 NOTE — ED Notes (Signed)
Pt was no answer for room ?

## 2021-08-29 NOTE — ED Provider Triage Note (Signed)
Emergency Medicine Provider Triage Evaluation Note ? ?Clyda Hurdle , a 41 y.o. female  was evaluated in triage.  Pt complains of headache, dizziness, near syncope.  Patient reports that for the last month and a half she has been having headache, dizziness, and near syncopal episodes.  Patient states that she becomes so dizzy to the point of almost passing out.  Patient will then develop a headache afterwards.  Once headache resolves she continues to have brain fogginess.  Patient states that she can no longer take any came to the emergency department to seek help. ? ?Patient reports that she has intermittent numbness to her right arm after waking from sleep.  Denies any numbness or weakness outside of this. ? ?Review of Systems  ?Positive: Headache, dizziness, near syncope, numbness ?Negative: Facial asymmetry, dysarthria, chest pain, shortness of breath, sudden onset of headache, visual disturbance, syncope ? ?Physical Exam  ?BP 131/86   Pulse 71   Temp 98.5 ?F (36.9 ?C) (Oral)   Resp 16   SpO2 100%  ?Gen:   Awake, no distress   ?Resp:  Normal effort  ?MSK:   Moves extremities without difficulty  ?Other:  +2 radial pulse bilaterally.  +5 strength to bilateral upper and lower extremities.  No facial asymmetry or dysarthria. ? ?Medical Decision Making  ?Medically screening exam initiated at 6:29 PM.  Appropriate orders placed.   Cohick was informed that the remainder of the evaluation will be completed by another provider, this initial triage assessment does not replace that evaluation, and the importance of remaining in the ED until their evaluation is complete. ? ? ?  ?Loni Beckwith, PA-C ?08/29/21 1831 ? ?

## 2021-08-30 ENCOUNTER — Emergency Department (HOSPITAL_COMMUNITY): Payer: BC Managed Care – PPO

## 2021-08-30 ENCOUNTER — Emergency Department (HOSPITAL_COMMUNITY)
Admission: EM | Admit: 2021-08-30 | Discharge: 2021-08-30 | Disposition: A | Discharge status: Home / Self Care | Payer: BC Managed Care – PPO | Attending: Emergency Medicine | Admitting: Emergency Medicine

## 2021-08-30 ENCOUNTER — Encounter (HOSPITAL_COMMUNITY): Payer: Self-pay

## 2021-08-30 ENCOUNTER — Other Ambulatory Visit: Payer: Self-pay

## 2021-08-30 DIAGNOSIS — R519 Headache, unspecified: Secondary | ICD-10-CM | POA: Diagnosis not present

## 2021-08-30 DIAGNOSIS — R42 Dizziness and giddiness: Secondary | ICD-10-CM | POA: Insufficient documentation

## 2021-08-30 DIAGNOSIS — G8929 Other chronic pain: Secondary | ICD-10-CM | POA: Insufficient documentation

## 2021-08-30 DIAGNOSIS — Z87891 Personal history of nicotine dependence: Secondary | ICD-10-CM | POA: Diagnosis not present

## 2021-08-30 LAB — TROPONIN I (HIGH SENSITIVITY): Troponin I (High Sensitivity): 3 ng/L (ref <18)

## 2021-08-30 IMAGING — CT CT HEAD W/O CM
4 series · 17 of 47 positions shown, 19 images · non-contrast
Comparison: None.

CLINICAL DATA: Dizziness, headache, vertigo



[Series 3: head wo · axial · 0.44mm/px · z∈[-166,-46]mm · 7 of 33 slices shown, 9 images]
[im 5/33  brain]
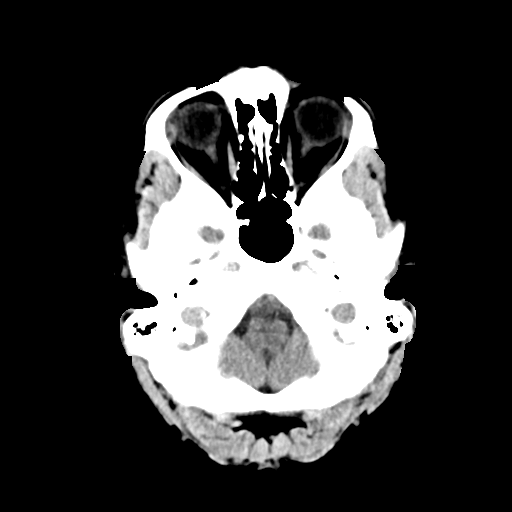
[im 5/33  bone]
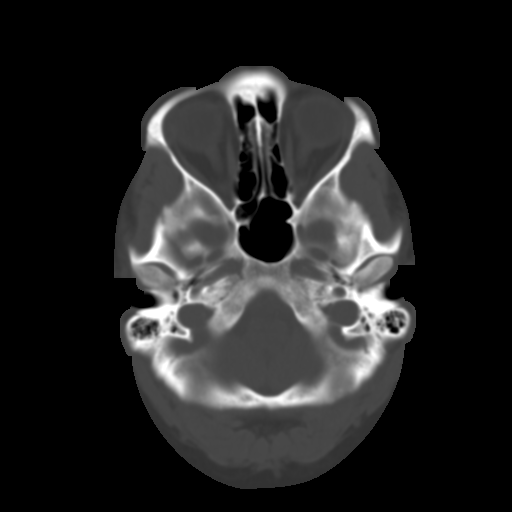
[im 9/33  brain]
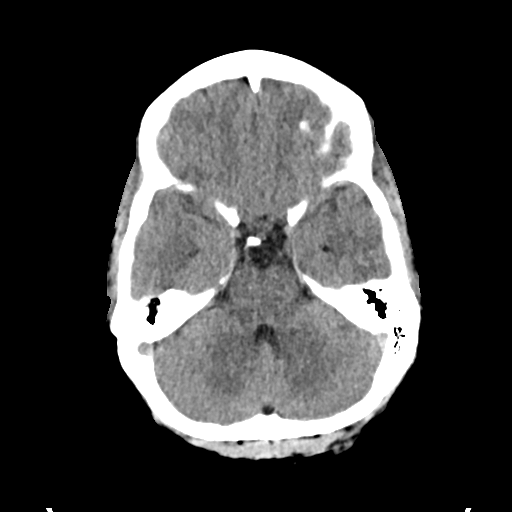
[im 13/33  brain]
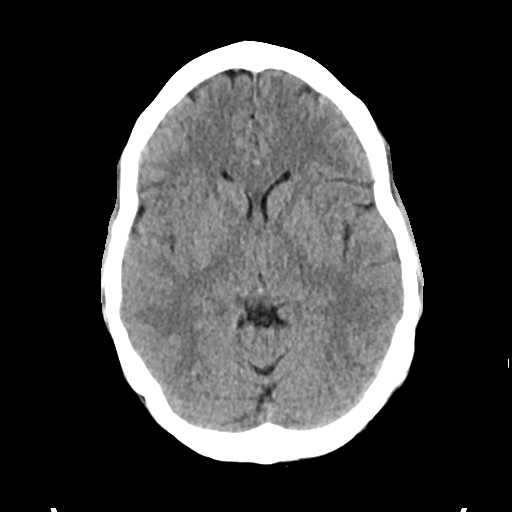
[im 17/33  brain]
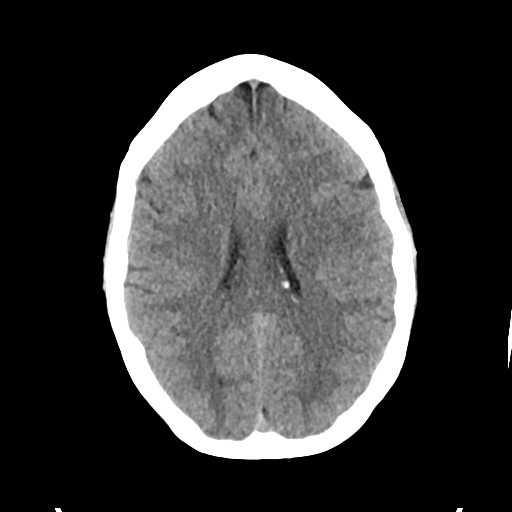
[im 21/33  brain]
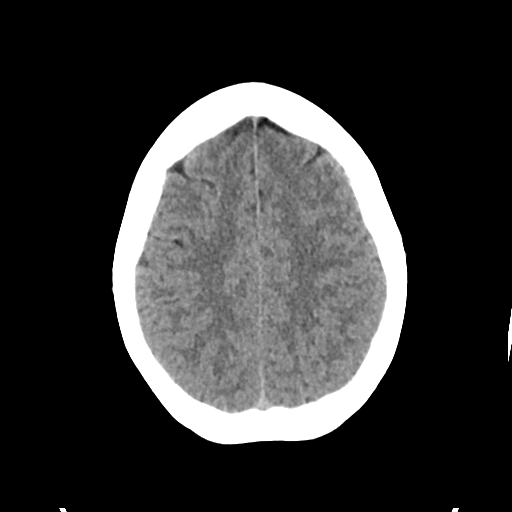
[im 21/33  bone]
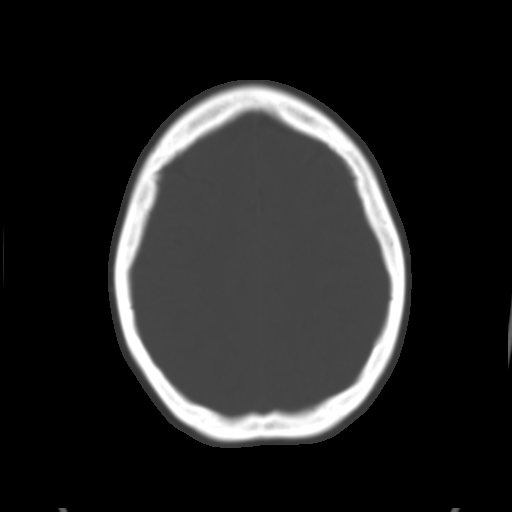
[im 25/33  brain]
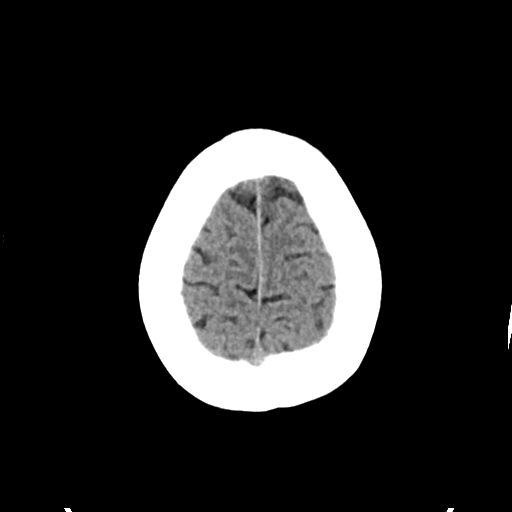
[im 29/33  brain]
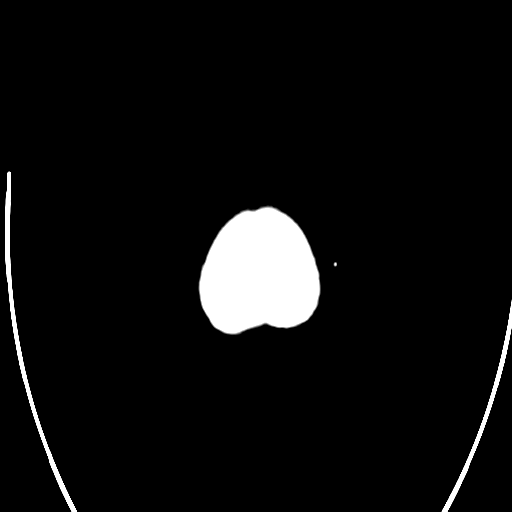

[Series 4: head bone · axial · 0.44mm/px · z∈[-170,-114]mm · 4 of 81 slices shown]
[im 9/81  bone]
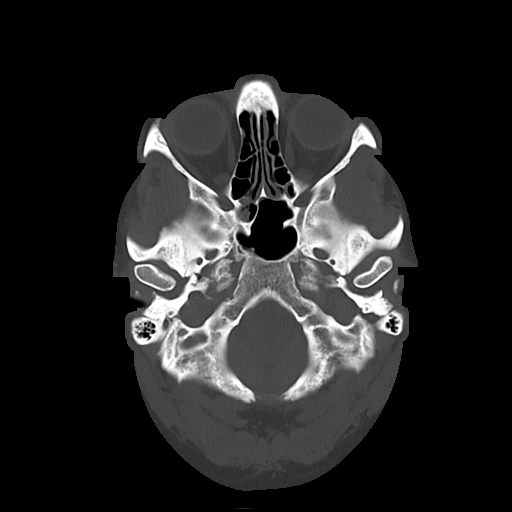
[im 17/81  bone]
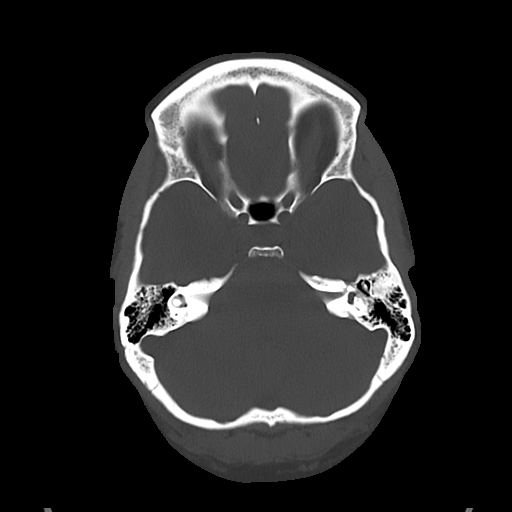
[im 25/81  bone]
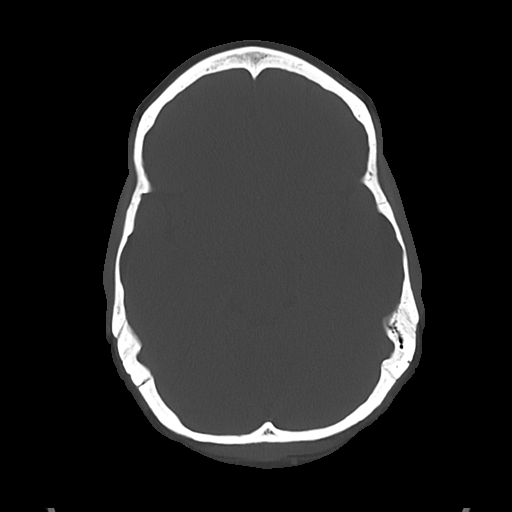
[im 37/81  bone]
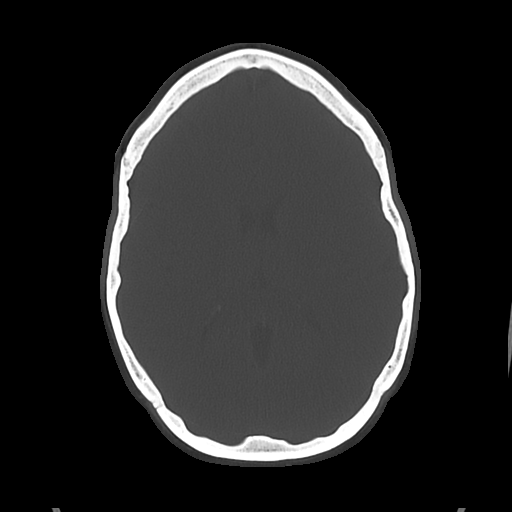

[Series 5: cor soft · coronal · 0.35mm/px · 3 of 70 slices shown]
[im 24/70  brain]
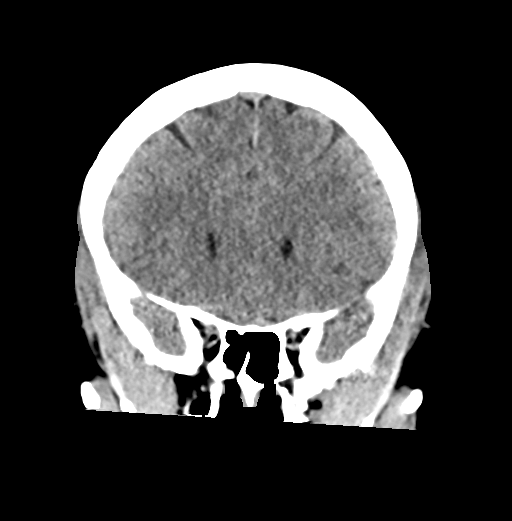
[im 31/70  brain]
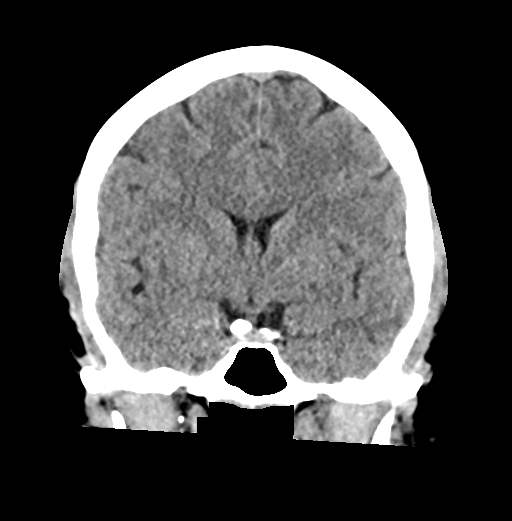
[im 39/70  brain]
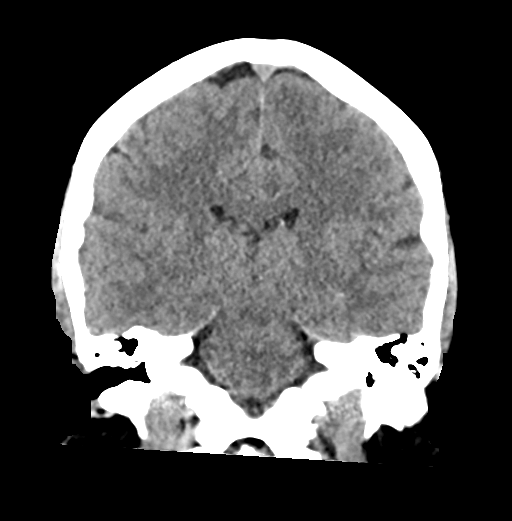

[Series 6: sag soft · sagittal · 0.35mm/px · 3 of 60 slices shown]
[im 20/60  brain]
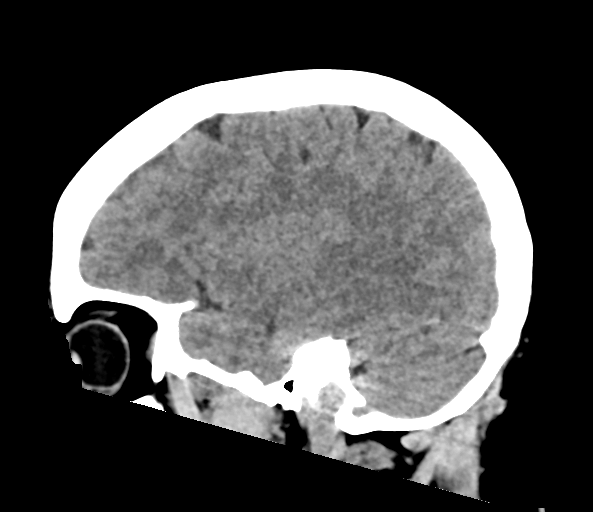
[im 30/60  brain]
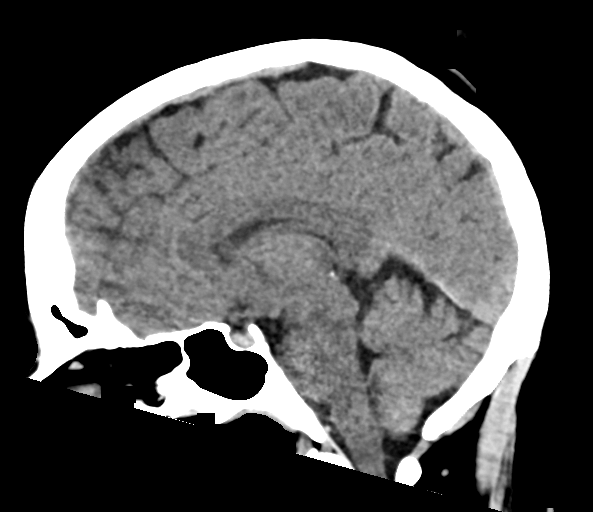
[im 40/60  brain]
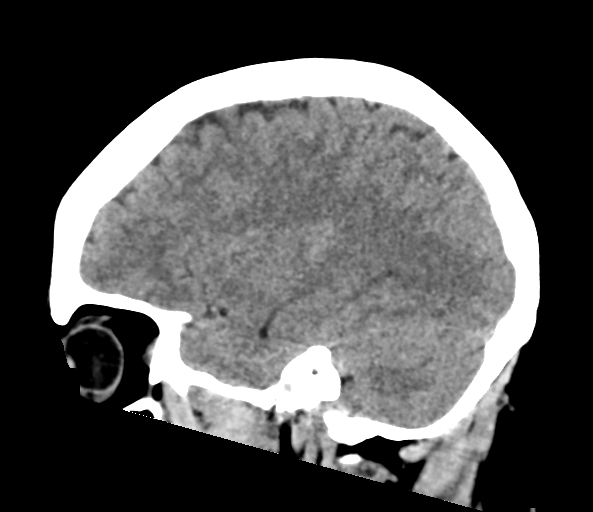

[17 of 47 positions shown; findings below may reference images not displayed]

FINDINGS: Brain: No evidence of acute infarction, hemorrhage, cerebral edema,
mass, mass effect, or midline shift. No hydrocephalus or extra-axial
fluid collection.

Vascular: No hyperdense vessel.

Skull: Normal. Negative for fracture or focal lesion.

Sinuses/Orbits: No acute finding.

Other: The mastoid air cells are well aerated.
IMPRESSION: IMPRESSION
No acute intracranial process. No etiology seen the patient's
vertigo or headache.
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
## 2021-08-30 NOTE — Discharge Instructions (Addendum)
You were evaluated in the Emergency Department and after careful evaluation, we did not find any emergent condition requiring admission or further testing in the hospital. ? ?Your exam/testing today is overall reassuring.  Recommend follow-up with neurology to discuss your recent headaches. ? ?Please return to the Emergency Department if you experience any worsening of your condition.   Thank you for allowing [place] to be a part of your care. ?

## 2021-08-30 NOTE — ED Triage Notes (Signed)
Patient  complaint of dizziness, headache, vertigo symptoms for 1 month. States that prior to this had some dizziness every once in a while, now has become an every day occurrence. Also c/o pain to base of neck ?

## 2021-09-14 ENCOUNTER — Other Ambulatory Visit: Payer: Self-pay

## 2021-09-14 ENCOUNTER — Encounter: Payer: Self-pay | Admitting: Family Medicine

## 2021-09-14 ENCOUNTER — Ambulatory Visit: Payer: BC Managed Care – PPO | Attending: Family Medicine | Admitting: Family Medicine

## 2021-09-14 VITALS — BP 116/72 | HR 80 | Ht 62.0 in | Wt 184.2 lb

## 2021-09-14 DIAGNOSIS — F32A Depression, unspecified: Secondary | ICD-10-CM

## 2021-09-14 DIAGNOSIS — T162XXA Foreign body in left ear, initial encounter: Secondary | ICD-10-CM

## 2021-09-14 DIAGNOSIS — B009 Herpesviral infection, unspecified: Secondary | ICD-10-CM

## 2021-09-14 DIAGNOSIS — R29818 Other symptoms and signs involving the nervous system: Secondary | ICD-10-CM

## 2021-09-14 DIAGNOSIS — R42 Dizziness and giddiness: Secondary | ICD-10-CM | POA: Diagnosis not present

## 2021-09-14 DIAGNOSIS — F419 Anxiety disorder, unspecified: Secondary | ICD-10-CM

## 2021-09-14 DIAGNOSIS — G44209 Tension-type headache, unspecified, not intractable: Secondary | ICD-10-CM

## 2021-09-14 MED ORDER — FLUOXETINE HCL 20 MG PO CAPS
20.0000 mg | ORAL_CAPSULE | Freq: Every day | ORAL | 3 refills | Status: DC
  Filled 2021-09-14: qty 30, 30d supply, fill #0
  Filled 2021-11-04: qty 30, 30d supply, fill #1
  Filled 2021-11-29: qty 30, 30d supply, fill #2
  Filled 2022-01-03: qty 30, 30d supply, fill #3

## 2021-09-14 MED ORDER — VALACYCLOVIR HCL 500 MG PO TABS
500.0000 mg | ORAL_TABLET | Freq: Every day | ORAL | 3 refills | Status: DC
  Filled 2021-09-14: qty 30, 30d supply, fill #0
  Filled 2021-11-04: qty 30, 30d supply, fill #1
  Filled 2021-11-29: qty 30, 30d supply, fill #2
  Filled 2022-01-03: qty 30, 30d supply, fill #3

## 2021-09-14 NOTE — Progress Notes (Signed)
Headache ?Dizzy spells for 2 months ?Restart depression medication. ?
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
## 2021-10-25 ENCOUNTER — Encounter: Payer: Self-pay | Admitting: Internal Medicine

## 2021-10-25 ENCOUNTER — Ambulatory Visit (INDEPENDENT_AMBULATORY_CARE_PROVIDER_SITE_OTHER): Payer: BC Managed Care – PPO | Admitting: Internal Medicine

## 2021-10-25 DIAGNOSIS — R0683 Snoring: Secondary | ICD-10-CM | POA: Diagnosis not present

## 2021-10-25 NOTE — Assessment & Plan Note (Signed)
She implies doubts about the validity of her previous sleep testing because of disconnected leads and frequent interruptions.  We will focus initial intervention at her dominant complaint of snoring as discussed.  If further testing seems appropriate, we may need to send a letter to her insurance to get authorization for an in-center sleep study. Plan-recommended weight loss, sleep position off flat of back, OTC non-fitted oral appliance for snoring, Breathe Right strips, treatment for allergic rhinitis if nasal congestion is a factor seasonally.

## 2021-10-25 NOTE — Patient Instructions (Addendum)
Suggestions to help snoring:  Try to sleep off the flat of your back  Look at drug stores for a mouthpiece for snoring (not for bruxism/ tooth grinding- that's different.  Try Breathe Right nasal strips.  Yes- it will help if you can lose some weight.  If these measures aren't enough, come back and we will see what we can do to get another sleep study.  Work note for today

## 2021-11-04 ENCOUNTER — Other Ambulatory Visit: Payer: Self-pay

## 2021-11-29 ENCOUNTER — Other Ambulatory Visit: Payer: Self-pay

## 2022-01-03 ENCOUNTER — Encounter: Payer: Self-pay | Admitting: Family Medicine

## 2022-01-03 ENCOUNTER — Ambulatory Visit: Payer: BC Managed Care – PPO | Attending: Family Medicine | Admitting: Family Medicine

## 2022-01-03 ENCOUNTER — Other Ambulatory Visit: Payer: Self-pay

## 2022-01-03 VITALS — BP 110/74 | HR 76 | Temp 98.0°F | Ht 62.0 in | Wt 174.0 lb

## 2022-01-03 DIAGNOSIS — F419 Anxiety disorder, unspecified: Secondary | ICD-10-CM

## 2022-01-03 DIAGNOSIS — E669 Obesity, unspecified: Secondary | ICD-10-CM

## 2022-01-03 DIAGNOSIS — B009 Herpesviral infection, unspecified: Secondary | ICD-10-CM | POA: Diagnosis not present

## 2022-01-03 DIAGNOSIS — Z13228 Encounter for screening for other metabolic disorders: Secondary | ICD-10-CM

## 2022-01-03 DIAGNOSIS — F32A Depression, unspecified: Secondary | ICD-10-CM

## 2022-01-03 MED ORDER — VALACYCLOVIR HCL 500 MG PO TABS
500.0000 mg | ORAL_TABLET | Freq: Every day | ORAL | 1 refills | Status: DC
  Filled 2022-01-11: qty 90, 90d supply, fill #0
  Filled 2022-01-11: qty 30, 30d supply, fill #0
  Filled 2022-02-05: qty 30, 30d supply, fill #1
  Filled 2022-03-23 – 2022-06-22 (×4): qty 30, 30d supply, fill #2

## 2022-01-03 MED ORDER — FLUOXETINE HCL 20 MG PO CAPS
20.0000 mg | ORAL_CAPSULE | Freq: Every day | ORAL | 1 refills | Status: DC
Start: 2022-01-03 — End: 2022-07-10
  Filled 2022-01-11: qty 90, 90d supply, fill #0

## 2022-01-03 NOTE — Patient Instructions (Signed)

## 2022-01-03 NOTE — Progress Notes (Signed)
No concerns. 
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
## 2022-01-04 ENCOUNTER — Other Ambulatory Visit: Payer: Self-pay

## 2022-01-04 LAB — CMP14+EGFR
ALT: 22 IU/L (ref 0–32)
AST: 16 IU/L (ref 0–40)
Albumin/Globulin Ratio: 2.1 (ref 1.2–2.2)
Albumin: 4.7 g/dL (ref 3.9–4.9)
Alkaline Phosphatase: 80 IU/L (ref 44–121)
BUN/Creatinine Ratio: 15 (ref 9–23)
BUN: 10 mg/dL (ref 6–24)
Bilirubin Total: 0.2 mg/dL (ref 0.0–1.2)
CO2: 26 mmol/L (ref 20–29)
Calcium: 9.1 mg/dL (ref 8.7–10.2)
Chloride: 102 mmol/L (ref 96–106)
Creatinine, Ser: 0.68 mg/dL (ref 0.57–1.00)
Globulin, Total: 2.2 g/dL (ref 1.5–4.5)
Glucose: 86 mg/dL (ref 70–99)
Potassium: 4.4 mmol/L (ref 3.5–5.2)
Sodium: 142 mmol/L (ref 134–144)
Total Protein: 6.9 g/dL (ref 6.0–8.5)
eGFR: 113 mL/min/{1.73_m2} (ref >59)

## 2022-01-04 LAB — LP+NON-HDL CHOLESTEROL
Cholesterol, Total: 172 mg/dL (ref 100–199)
HDL: 56 mg/dL (ref >39)
LDL Chol Calc (NIH): 90 mg/dL (ref 0–99)
Total Non-HDL-Chol (LDL+VLDL): 116 mg/dL (ref 0–129)
Triglycerides: 152 mg/dL — ABNORMAL HIGH (ref 0–149)
VLDL Cholesterol Cal: 26 mg/dL (ref 5–40)

## 2022-01-06 ENCOUNTER — Other Ambulatory Visit: Payer: Self-pay

## 2022-01-11 ENCOUNTER — Other Ambulatory Visit: Payer: Self-pay

## 2022-01-11 ENCOUNTER — Ambulatory Visit: Payer: BC Managed Care – PPO

## 2022-02-06 ENCOUNTER — Other Ambulatory Visit: Payer: Self-pay

## 2022-02-08 ENCOUNTER — Other Ambulatory Visit: Payer: Self-pay

## 2022-03-24 ENCOUNTER — Other Ambulatory Visit: Payer: Self-pay

## 2022-03-31 ENCOUNTER — Other Ambulatory Visit: Payer: Self-pay

## 2022-04-05 ENCOUNTER — Other Ambulatory Visit: Payer: Self-pay

## 2022-04-12 ENCOUNTER — Other Ambulatory Visit: Payer: Self-pay

## 2022-06-14 ENCOUNTER — Other Ambulatory Visit: Payer: Self-pay

## 2022-06-26 ENCOUNTER — Other Ambulatory Visit: Payer: Self-pay

## 2022-06-28 ENCOUNTER — Other Ambulatory Visit: Payer: Self-pay

## 2022-06-29 ENCOUNTER — Other Ambulatory Visit: Payer: Self-pay

## 2022-07-10 ENCOUNTER — Encounter: Payer: Self-pay | Admitting: Family Medicine

## 2022-07-10 ENCOUNTER — Ambulatory Visit: Payer: BC Managed Care – PPO | Attending: Family Medicine | Admitting: Family Medicine

## 2022-07-10 ENCOUNTER — Other Ambulatory Visit: Payer: Self-pay

## 2022-07-10 VITALS — BP 118/76 | HR 81 | Temp 98.0°F | Ht 62.0 in | Wt 192.2 lb

## 2022-07-10 DIAGNOSIS — E668 Other obesity: Secondary | ICD-10-CM

## 2022-07-10 DIAGNOSIS — L719 Rosacea, unspecified: Secondary | ICD-10-CM

## 2022-07-10 DIAGNOSIS — F32A Depression, unspecified: Secondary | ICD-10-CM | POA: Diagnosis not present

## 2022-07-10 DIAGNOSIS — F419 Anxiety disorder, unspecified: Secondary | ICD-10-CM

## 2022-07-10 MED ORDER — BUPROPION HCL ER (XL) 150 MG PO TB24
150.0000 mg | ORAL_TABLET | Freq: Every day | ORAL | 1 refills | Status: DC
  Filled 2022-07-10 (×2): qty 90, 90d supply, fill #0
  Filled 2022-10-25: qty 90, 90d supply, fill #1

## 2022-07-10 MED ORDER — METRONIDAZOLE 1 % EX GEL
Freq: Every day | CUTANEOUS | 2 refills | Status: DC
  Filled 2022-07-10: qty 60, 30d supply, fill #0

## 2022-07-10 NOTE — Patient Instructions (Addendum)
Calorie Counting for Weight Loss Calories are units of energy. Your body needs a certain number of calories from food to keep going throughout the day. When you eat or drink more calories than your body needs, your body stores the extra calories mostly as fat. When you eat or drink fewer calories than your body needs, your body burns fat to get the energy it needs. Calorie counting means keeping track of how many calories you eat and drink each day. Calorie counting can be helpful if you need to lose weight. If you eat fewer calories than your body needs, you should lose weight. Ask your health care provider what a healthy weight is for you. For calorie counting to work, you will need to eat the right number of calories each day to lose a healthy amount of weight per week. A dietitian can help you figure out how many calories you need in a day and will suggest ways to reach your calorie goal. A healthy amount of weight to lose each week is usually 1-2 lb (0.5-0.9 kg). This usually means that your daily calorie intake should be reduced by 500-750 calories. Eating 1,200-1,500 calories a day can help most women lose weight. Eating 1,500-1,800 calories a day can help most men lose weight. What do I need to know about calorie counting? Work with your health care provider or dietitian to determine how many calories you should get each day. To meet your daily calorie goal, you will need to: Find out how many calories are in each food that you would like to eat. Try to do this before you eat. Decide how much of the food you plan to eat. Keep a food log. Do this by writing down what you ate and how many calories it had. To successfully lose weight, it is important to balance calorie counting with a healthy lifestyle that includes regular activity. Where do I find calorie information?  The number of calories in a food can be found on a Nutrition Facts label. If a food does not have a Nutrition Facts label, try  to look up the calories online or ask your dietitian for help. Remember that calories are listed per serving. If you choose to have more than one serving of a food, you will have to multiply the calories per serving by the number of servings you plan to eat. For example, the label on a package of bread might say that a serving size is 1 slice and that there are 90 calories in a serving. If you eat 1 slice, you will have eaten 90 calories. If you eat 2 slices, you will have eaten 180 calories. How do I keep a food log? After each time that you eat, record the following in your food log as soon as possible: What you ate. Be sure to include toppings, sauces, and other extras on the food. How much you ate. This can be measured in cups, ounces, or number of items. How many calories were in each food and drink. The total number of calories in the food you ate. Keep your food log near you, such as in a pocket-sized notebook or on an app or website on your mobile phone. Some programs will calculate calories for you and show you how many calories you have left to meet your daily goal. What are some portion-control tips? Know how many calories are in a serving. This will help you know how many servings you can have of a certain   food. Use a measuring cup to measure serving sizes. You could also try weighing out portions on a kitchen scale. With time, you will be able to estimate serving sizes for some foods. Take time to put servings of different foods on your favorite plates or in your favorite bowls and cups so you know what a serving looks like. Try not to eat straight from a food's packaging, such as from a bag or box. Eating straight from the package makes it hard to see how much you are eating and can lead to overeating. Put the amount you would like to eat in a cup or on a plate to make sure you are eating the right portion. Use smaller plates, glasses, and bowls for smaller portions and to prevent  overeating. Try not to multitask. For example, avoid watching TV or using your computer while eating. If it is time to eat, sit down at a table and enjoy your food. This will help you recognize when you are full. It will also help you be more mindful of what and how much you are eating. What are tips for following this plan? Reading food labels Check the calorie count compared with the serving size. The serving size may be smaller than what you are used to eating. Check the source of the calories. Try to choose foods that are high in protein, fiber, and vitamins, and low in saturated fat, trans fat, and sodium. Shopping Read nutrition labels while you shop. This will help you make healthy decisions about which foods to buy. Pay attention to nutrition labels for low-fat or fat-free foods. These foods sometimes have the same number of calories or more calories than the full-fat versions. They also often have added sugar, starch, or salt to make up for flavor that was removed with the fat. Make a grocery list of lower-calorie foods and stick to it. Cooking Try to cook your favorite foods in a healthier way. For example, try baking instead of frying. Use low-fat dairy products. Meal planning Use more fruits and vegetables. One-half of your plate should be fruits and vegetables. Include lean proteins, such as chicken, turkey, and fish. Lifestyle Each week, aim to do one of the following: 150 minutes of moderate exercise, such as walking. 75 minutes of vigorous exercise, such as running. General information Know how many calories are in the foods you eat most often. This will help you calculate calorie counts faster. Find a way of tracking calories that works for you. Get creative. Try different apps or programs if writing down calories does not work for you. What foods should I eat?  Eat nutritious foods. It is better to have a nutritious, high-calorie food, such as an avocado, than a food with  few nutrients, such as a bag of potato chips. Use your calories on foods and drinks that will fill you up and will not leave you hungry soon after eating. Examples of foods that fill you up are nuts and nut butters, vegetables, lean proteins, and high-fiber foods such as whole grains. High-fiber foods are foods with more than 5 g of fiber per serving. Pay attention to calories in drinks. Low-calorie drinks include water and unsweetened drinks. The items listed above may not be a complete list of foods and beverages you can eat. Contact a dietitian for more information. What foods should I limit? Limit foods or drinks that are not good sources of vitamins, minerals, or protein or that are high in unhealthy fats. These   include: Candy. Other sweets. Sodas, specialty coffee drinks, alcohol, and juice. The items listed above may not be a complete list of foods and beverages you should avoid. Contact a dietitian for more information. How do I count calories when eating out? Pay attention to portions. Often, portions are much larger when eating out. Try these tips to keep portions smaller: Consider sharing a meal instead of getting your own. If you get your own meal, eat only half of it. Before you start eating, ask for a container and put half of your meal into it. When available, consider ordering smaller portions from the menu instead of full portions. Pay attention to your food and drink choices. Knowing the way food is cooked and what is included with the meal can help you eat fewer calories. If calories are listed on the menu, choose the lower-calorie options. Choose dishes that include vegetables, fruits, whole grains, low-fat dairy products, and lean proteins. Choose items that are boiled, broiled, grilled, or steamed. Avoid items that are buttered, battered, fried, or served with cream sauce. Items labeled as crispy are usually fried, unless stated otherwise. Choose water, low-fat milk,  unsweetened iced tea, or other drinks without added sugar. If you want an alcoholic beverage, choose a lower-calorie option, such as a glass of wine or light beer. Ask for dressings, sauces, and syrups on the side. These are usually high in calories, so you should limit the amount you eat. If you want a salad, choose a garden salad and ask for grilled meats. Avoid extra toppings such as bacon, cheese, or fried items. Ask for the dressing on the side, or ask for olive oil and vinegar or lemon to use as dressing. Estimate how many servings of a food you are given. Knowing serving sizes will help you be aware of how much food you are eating at restaurants. Where to find more information Centers for Disease Control and Prevention: www.cdc.gov U.S. Department of Agriculture: myplate.gov Summary Calorie counting means keeping track of how many calories you eat and drink each day. If you eat fewer calories than your body needs, you should lose weight. A healthy amount of weight to lose per week is usually 1-2 lb (0.5-0.9 kg). This usually means reducing your daily calorie intake by 500-750 calories. The number of calories in a food can be found on a Nutrition Facts label. If a food does not have a Nutrition Facts label, try to look up the calories online or ask your dietitian for help. Use smaller plates, glasses, and bowls for smaller portions and to prevent overeating. Use your calories on foods and drinks that will fill you up and not leave you hungry shortly after a meal. This information is not intended to replace advice given to you by your health care provider. Make sure you discuss any questions you have with your health care provider. Document Revised: 06/05/2019 Document Reviewed: 06/05/2019 Elsevier Patient Education  2023 Elsevier Inc.  
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
## 2022-07-10 NOTE — Progress Notes (Signed)
Discuss weight Face breaking out.

## 2022-07-12 ENCOUNTER — Other Ambulatory Visit: Payer: Self-pay

## 2022-07-14 ENCOUNTER — Other Ambulatory Visit: Payer: Self-pay

## 2022-10-25 ENCOUNTER — Other Ambulatory Visit: Payer: Self-pay

## 2022-11-01 ENCOUNTER — Other Ambulatory Visit: Payer: Self-pay

## 2022-12-07 ENCOUNTER — Other Ambulatory Visit: Payer: Self-pay

## 2023-01-10 ENCOUNTER — Ambulatory Visit: Payer: BC Managed Care – PPO | Admitting: Family Medicine

## 2023-04-11 ENCOUNTER — Emergency Department (HOSPITAL_BASED_OUTPATIENT_CLINIC_OR_DEPARTMENT_OTHER)
Admission: EM | Admit: 2023-04-11 | Discharge: 2023-04-11 | Disposition: A | Discharge status: Home / Self Care | Payer: BC Managed Care – PPO | Attending: Emergency Medicine | Admitting: Emergency Medicine

## 2023-04-11 ENCOUNTER — Other Ambulatory Visit: Payer: Self-pay

## 2023-04-11 ENCOUNTER — Other Ambulatory Visit (HOSPITAL_BASED_OUTPATIENT_CLINIC_OR_DEPARTMENT_OTHER): Payer: Self-pay

## 2023-04-11 ENCOUNTER — Encounter (HOSPITAL_BASED_OUTPATIENT_CLINIC_OR_DEPARTMENT_OTHER): Payer: Self-pay | Admitting: Emergency Medicine

## 2023-04-11 DIAGNOSIS — J028 Acute pharyngitis due to other specified organisms: Secondary | ICD-10-CM | POA: Diagnosis not present

## 2023-04-11 DIAGNOSIS — J029 Acute pharyngitis, unspecified: Secondary | ICD-10-CM | POA: Diagnosis present

## 2023-04-11 DIAGNOSIS — B9789 Other viral agents as the cause of diseases classified elsewhere: Secondary | ICD-10-CM | POA: Diagnosis not present

## 2023-04-11 DIAGNOSIS — Z20822 Contact with and (suspected) exposure to covid-19: Secondary | ICD-10-CM | POA: Diagnosis not present

## 2023-04-11 LAB — GROUP A STREP BY PCR: Group A Strep by PCR: NOT DETECTED

## 2023-04-11 LAB — RESP PANEL BY RT-PCR (RSV, FLU A&B, COVID)  RVPGX2
Influenza A by PCR: NEGATIVE
Influenza B by PCR: NEGATIVE
Resp Syncytial Virus by PCR: NEGATIVE
SARS Coronavirus 2 by RT PCR: NEGATIVE

## 2023-04-11 MED ORDER — BENZONATATE 100 MG PO CAPS
100.0000 mg | ORAL_CAPSULE | Freq: Three times a day (TID) | ORAL | 0 refills | Status: DC
  Filled 2023-04-11: qty 21, 7d supply, fill #0

## 2023-04-11 MED ORDER — DEXAMETHASONE 4 MG PO TABS
10.0000 mg | ORAL_TABLET | Freq: Once | ORAL | Status: AC
  Administered 2023-04-11: 10 mg via ORAL
  Filled 2023-04-11: qty 3

## 2023-04-11 NOTE — ED Triage Notes (Signed)
Pt with sore throat and fever/chills

## 2023-04-11 NOTE — ED Provider Notes (Signed)
[facility] Provider Note   CSN: [id number] Arrival date & time: 04/11/23  1548     History Chief Complaint  Patient presents with   Sore Throat    [name] [name] is a 42 y.o. female.  Patient without significant past medical history presents the emergency department concerns of a sore throat.  Reports that she has been having sore throat, fever and chills for the last several days.  Suspect that she may be in contact with a surgical evaluation for possible colitis.  Has not measured any temperatures at home but endorsing alternating between feeling hot and cold.  States that the pain although not significantly to disrupt eating and drinking. States that she has tried some OTC medications for symptom control without improvement. Denies nausea, vomiting, diarrhea, abdominal pain, chest pain, or SOB.   Sore Throat       Home Medications Prior to Admission medications   Medication Sig Start Date End Date Taking? Authorizing Provider  benzonatate (TESSALON) 100 MG capsule Take 1 capsule (100 mg total) by mouth every 8 (eight) hours. 04/11/23  Yes Smitty Knudsen, PA-C  buPROPion (WELLBUTRIN XL) 150 MG 24 hr tablet Take 1 tablet (150 mg total) by mouth daily. 07/10/22   Hoy Register, MD  ibuprofen (IBU) 600 MG tablet Take 1 tablet (600 mg total) by mouth every 6 (six) hours as needed. Patient not taking: Reported on 01/03/2022 05/14/21   Derwood Kaplan, MD  metroNIDAZOLE (METROGEL) 1 % gel Apply topically daily. For Rosacea 07/10/22   Hoy Register, MD  valACYclovir (VALTREX) 500 MG tablet Take 1 tablet (500 mg total) by mouth daily. For herpes prophylaxis 01/03/22   Hoy Register, MD      Allergies    Lexapro [escitalopram oxalate]    Review of Systems   Review of Systems  HENT:  Positive for sore throat.   All other systems reviewed and are negative.   Physical Exam Updated Vital Signs BP 138/81   Pulse 88   Temp 98.4 F (36.9 C)    Resp 20   Ht 5\' 2"  (1.575 m)   Wt 88 kg   LMP 03/19/2023   SpO2 100%   BMI 35.48 kg/m  Physical Exam Vitals and nursing note reviewed.  Constitutional:      General: She is not in acute distress.    Appearance: She is well-developed.  HENT:     Head: Normocephalic and atraumatic.     Mouth/Throat:     Mouth: Mucous membranes are moist. No oral lesions.     Pharynx: Uvula midline. Posterior oropharyngeal erythema present. No pharyngeal swelling, oropharyngeal exudate or uvula swelling.     Comments: No white exudative spots seen on tonsils. Uvula midline with no evidence of PTA. Eyes:     Conjunctiva/sclera: Conjunctivae normal.  Cardiovascular:     Rate and Rhythm: Normal rate and regular rhythm.     Heart sounds: No murmur heard. Pulmonary:     Effort: Pulmonary effort is normal. No respiratory distress.     Breath sounds: Normal breath sounds.  Abdominal:     Palpations: Abdomen is soft.     Tenderness: There is no abdominal tenderness.  Musculoskeletal:        General: No swelling.     Cervical back: Neck supple.  Skin:    General: Skin is warm and dry.     Capillary Refill: Capillary refill takes less than 2 seconds.  Neurological:  Mental Status: She is alert.  Psychiatric:        Mood and Affect: Mood normal.     ED Results / Procedures / Treatments   Labs (all labs ordered are listed, but only abnormal results are displayed) Labs Reviewed  GROUP A STREP BY PCR  RESP PANEL BY RT-PCR (RSV, FLU A&B, COVID)  [id number]    EKG None  Radiology No results found.  Procedures Procedures   Medications Ordered in ED Medications  dexamethasone (DECADRON) tablet 10 mg (10 mg Oral Given 04/11/23 1730)    ED Course/ Medical Decision Making/ A&P                               Medical Decision Making Risk Prescription drug management.   This patient presents to the ED for concern of sore throat. Differential diagnosis includes strep pharyngitis, viral  pharyngitis, PTA, esophagitis, allergic rhinitis   Lab Tests:  I Ordered, and personally interpreted labs.  The pertinent results include: Negative for COVID-19, influenza, RSV, group A strep   Medicines ordered and prescription drug management:  I ordered medication including Decadron for sore throat Reevaluation of the patient after these medicines showed that the patient stayed the same I have reviewed the patients home medicines and have made adjustments as needed   Problem List / ED Course:  Patient without significant past medical history presents the emergency department with concerns of a sore throat.  States that she believes that she may have a given contact with a sick coworker who was feeling somewhat similar symptoms.  Denies any difficulty eating or drinking due to the extent of the sore throat.  Fevers that she has been reporting are subjective in nature she states that she is alternating between feeling hot and cold.  Respiratory panel and group A strep test ordered from triage for assessment of symptoms.  There is mild oropharyngeal erythema but no tonsillar exudate seen. Patient is negative for COVID-19, influenza and RSV.  Group A strep negative.  Given reassuring findings, will treat as viral pharyngitis at this time.  A dose of Decadron given for symptom control.  Prescription for Tessalon sent to patient's pharmacy.  Encourage patient to follow-up with primary care provider for repeat evaluation.  Discussed strict return precautions. Patient discharged home in stable condition.  Final Clinical Impression(s) / ED Diagnoses Final diagnoses:  Acute viral pharyngitis    Rx / DC Orders ED Discharge Orders          Ordered    benzonatate (TESSALON) 100 MG capsule  Every 8 hours        04/11/23 1729              Smitty Knudsen, PA-C 04/11/23 1739    Rexford Maus, DO 04/11/23 2311

## 2023-04-11 NOTE — Discharge Instructions (Addendum)
You were seen in the ER today with concerns of a sore throat. You tested negative for flu, COVID, and RSV as well as strep. I suspect your symptoms are due to viral pharyngitis that you may have picked up from a coworker, friend, or family. We have you a dose of Decadron here in the ER to help with the sore throat. If symptoms are worsening, return to the ER. Otherwise, follow up with your primary care provider.

## 2023-09-30 ENCOUNTER — Other Ambulatory Visit: Payer: Self-pay

## 2023-12-20 ENCOUNTER — Emergency Department (HOSPITAL_BASED_OUTPATIENT_CLINIC_OR_DEPARTMENT_OTHER)

## 2023-12-20 ENCOUNTER — Other Ambulatory Visit: Payer: Self-pay

## 2023-12-20 ENCOUNTER — Encounter (HOSPITAL_BASED_OUTPATIENT_CLINIC_OR_DEPARTMENT_OTHER): Payer: Self-pay | Admitting: Emergency Medicine

## 2023-12-20 ENCOUNTER — Emergency Department (HOSPITAL_BASED_OUTPATIENT_CLINIC_OR_DEPARTMENT_OTHER)
Admission: EM | Admit: 2023-12-20 | Discharge: 2023-12-20 | Disposition: A | Discharge status: Home / Self Care | Attending: Emergency Medicine | Admitting: Emergency Medicine

## 2023-12-20 DIAGNOSIS — Z87891 Personal history of nicotine dependence: Secondary | ICD-10-CM | POA: Insufficient documentation

## 2023-12-20 DIAGNOSIS — S99911A Unspecified injury of right ankle, initial encounter: Secondary | ICD-10-CM | POA: Diagnosis present

## 2023-12-20 DIAGNOSIS — S93401A Sprain of unspecified ligament of right ankle, initial encounter: Secondary | ICD-10-CM | POA: Diagnosis not present

## 2023-12-20 DIAGNOSIS — W010XXA Fall on same level from slipping, tripping and stumbling without subsequent striking against object, initial encounter: Secondary | ICD-10-CM | POA: Diagnosis not present
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
## 2023-12-20 NOTE — ED Triage Notes (Addendum)
 Pt via POV c/o right ankle pain x 1.5-2 weeks, worse after her dogs tripped her while walking outside earlier today. Pt states she twisted her ankle when she fell at the time, and now she has residual pain and a mild limp. Pain rated 3/10 throbbing.

## 2023-12-20 NOTE — Discharge Instructions (Addendum)
 Please take Tylenol  (acetaminophen ) and Motrin  (ibuprofen ) for your symptoms at home.  You can take 1000 mg of Tylenol  every 6 hours and 600 mg of Motrin  every 6 hours as needed for your symptoms.  You can take these medicines together as needed, either at the same time, or alternating every 3 hours.  Please also ice and elevate your leg as much as possible.  Your x-rays were negative.  We anticipate that your ankle sprain should get better on its own.  If you would like to follow-up with the specialist you can follow-up with your podiatrist that you have seen previously.

## 2023-12-25 ENCOUNTER — Ambulatory Visit

## 2023-12-25 ENCOUNTER — Other Ambulatory Visit: Payer: Self-pay

## 2023-12-25 ENCOUNTER — Encounter: Payer: Self-pay | Admitting: Podiatry

## 2023-12-25 ENCOUNTER — Ambulatory Visit (INDEPENDENT_AMBULATORY_CARE_PROVIDER_SITE_OTHER): Admitting: Podiatry

## 2023-12-25 DIAGNOSIS — S93601A Unspecified sprain of right foot, initial encounter: Secondary | ICD-10-CM | POA: Diagnosis not present

## 2023-12-25 MED ORDER — METHYLPREDNISOLONE 4 MG PO TBPK
ORAL_TABLET | ORAL | 0 refills | Status: DC
  Filled 2023-12-25: qty 21, 6d supply, fill #0

## 2023-12-25 MED ORDER — MELOXICAM 15 MG PO TABS
15.0000 mg | ORAL_TABLET | Freq: Every day | ORAL | 3 refills | Status: AC
  Filled 2023-12-25: qty 30, 30d supply, fill #0

## 2023-12-26 ENCOUNTER — Other Ambulatory Visit: Payer: Self-pay
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
## 2023-12-27 ENCOUNTER — Other Ambulatory Visit: Payer: Self-pay

## 2024-01-02 ENCOUNTER — Other Ambulatory Visit: Payer: Self-pay

## 2024-01-03 ENCOUNTER — Other Ambulatory Visit: Payer: Self-pay

## 2024-01-22 ENCOUNTER — Ambulatory Visit (INDEPENDENT_AMBULATORY_CARE_PROVIDER_SITE_OTHER): Admitting: Podiatry

## 2024-01-22 ENCOUNTER — Encounter: Payer: Self-pay | Admitting: Podiatry

## 2024-01-22 DIAGNOSIS — S93601D Unspecified sprain of right foot, subsequent encounter: Secondary | ICD-10-CM

## 2024-01-23 NOTE — Progress Notes (Signed)
 She presents today for follow-up of her right ankle states that is doing so much better really does not hurt at disorder feels weak.  Objective: Vital signs are stable alert and oriented x 3.  There is no erythema edema salines drainage or odor no reproducible pain.  She has 5 out of 5 dorsiflexion plantarflexion inversion eversion ultras musculature appears to be intact.  She has no anterior posterior drawer signs.  Assessment: Slowly resolving ankle pain/sprain.  Plan: Follow-up with me on an as-needed basis.  Recommend that she continue therapy.

## 2024-04-08 ENCOUNTER — Other Ambulatory Visit: Payer: Self-pay

## 2024-04-08 ENCOUNTER — Other Ambulatory Visit: Payer: Self-pay | Admitting: Family Medicine

## 2024-04-08 ENCOUNTER — Encounter: Payer: Self-pay | Admitting: Family Medicine

## 2024-04-08 ENCOUNTER — Other Ambulatory Visit (HOSPITAL_BASED_OUTPATIENT_CLINIC_OR_DEPARTMENT_OTHER): Payer: Self-pay

## 2024-04-08 DIAGNOSIS — B009 Herpesviral infection, unspecified: Secondary | ICD-10-CM

## 2024-04-08 DIAGNOSIS — A6000 Herpesviral infection of urogenital system, unspecified: Secondary | ICD-10-CM

## 2024-04-08 MED ORDER — VALACYCLOVIR HCL 1 G PO TABS
1000.0000 mg | ORAL_TABLET | Freq: Every day | ORAL | 0 refills | Status: DC
  Filled 2024-04-08: qty 5, 5d supply, fill #0

## 2024-04-08 MED ORDER — VALACYCLOVIR HCL 1 G PO TABS
1000.0000 mg | ORAL_TABLET | Freq: Every day | ORAL | 0 refills | Status: AC
  Filled 2024-04-08: qty 5, 5d supply, fill #0

## 2024-04-08 NOTE — Telephone Encounter (Signed)
Please see the MyChart message reply(ies) for my assessment and plan.    This patient gave consent for this Medical Advice Message and is aware that it may result in a bill to their insurance company, as well as the possibility of receiving a bill for a co-payment or deductible. They are an established patient, but are not seeking medical advice exclusively about a problem treated during an in person or video visit in the last seven days. I did not recommend an in person or video visit within seven days of my reply.    I spent a total of 5 minutes cumulative time within 7 days through MyChart messaging.  Enobong Newlin, MD   

## 2024-04-09 ENCOUNTER — Other Ambulatory Visit: Payer: Self-pay

## 2024-05-06 ENCOUNTER — Encounter (HOSPITAL_BASED_OUTPATIENT_CLINIC_OR_DEPARTMENT_OTHER): Payer: Self-pay

## 2024-05-06 ENCOUNTER — Other Ambulatory Visit: Payer: Self-pay

## 2024-05-06 ENCOUNTER — Emergency Department (HOSPITAL_BASED_OUTPATIENT_CLINIC_OR_DEPARTMENT_OTHER): Admission: EM | Admit: 2024-05-06 | Discharge: 2024-05-06 | Disposition: A | Discharge status: Home / Self Care

## 2024-05-06 ENCOUNTER — Other Ambulatory Visit (HOSPITAL_BASED_OUTPATIENT_CLINIC_OR_DEPARTMENT_OTHER): Payer: Self-pay

## 2024-05-06 DIAGNOSIS — M5412 Radiculopathy, cervical region: Secondary | ICD-10-CM | POA: Insufficient documentation

## 2024-05-06 DIAGNOSIS — M25512 Pain in left shoulder: Secondary | ICD-10-CM | POA: Diagnosis present

## 2024-05-06 MED ORDER — LIDOCAINE 5 % EX PTCH
1.0000 | MEDICATED_PATCH | CUTANEOUS | Status: DC
  Administered 2024-05-06: 1 via TRANSDERMAL
  Filled 2024-05-06: qty 1

## 2024-05-06 MED ORDER — ACETAMINOPHEN 500 MG PO TABS
1000.0000 mg | ORAL_TABLET | Freq: Once | ORAL | Status: AC
  Administered 2024-05-06: 1000 mg via ORAL
  Filled 2024-05-06: qty 2

## 2024-05-06 MED ORDER — METHOCARBAMOL 500 MG PO TABS
500.0000 mg | ORAL_TABLET | Freq: Two times a day (BID) | ORAL | 0 refills | Status: AC
  Filled 2024-05-06: qty 20, 10d supply, fill #0

## 2024-05-06 MED ORDER — KETOROLAC TROMETHAMINE 15 MG/ML IJ SOLN
15.0000 mg | Freq: Once | INTRAMUSCULAR | Status: AC
  Administered 2024-05-06: 15 mg via INTRAMUSCULAR
  Filled 2024-05-06: qty 1

## 2024-05-06 NOTE — ED Triage Notes (Signed)
 Pt reports pain left shoulder radiating up neck x 2 weeks Finger tips are numb and tingly. Denies injury

## 2024-05-06 NOTE — Discharge Instructions (Addendum)
 As discussed, please follow-up with orthopedics and primary care.  Seek emergency care if experiencing any new or worsening symptoms.  Alternating between 650 mg Tylenol  and 400 mg Advil : The best way to alternate taking Acetaminophen  (example Tylenol ) and Ibuprofen  (example Advil /Motrin ) is to take them 3 hours apart. For example, if you take ibuprofen  at 6 am you can then take Tylenol  at 9 am. You can continue this regimen throughout the day, making sure you do not exceed the recommended maximum dose for each drug.

## 2024-05-06 NOTE — ED Provider Notes (Addendum)
 " [place] [facility] Provider Note   CSN: [id number] Arrival date & time: 05/06/24  9046     Patient presents with: Shoulder Pain   [name] [name] is a 43 y.o. female with PMHx migraines who presents to ED concerned for left shoulder pain radiating up towards the neck x2 weeks. Denies injury. States that her entire left trapezius feels tight and knotted. Intermittent tingling in her left hand. 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 Has not taken any OTC medications for symptoms, but her husband did attempt to give her a massage yesterday without much help. States that it feels like the inflammation from her muscles are irritating her nerves.   Denies concern for pregnancy or CKD or allergies to NSAIDs.     Shoulder Pain      Prior to Admission medications  Medication Sig Start Date End Date Taking? Authorizing Provider  methocarbamol  (ROBAXIN ) 500 MG tablet Take 1 tablet (500 mg total) by mouth 2 (two) times daily. 05/06/24  Yes Hoy Fraction F, PA-C  meloxicam  (MOBIC ) 15 MG tablet Take 1 tablet (15 mg total) by mouth daily. 12/25/23   Hyatt, Max T, DPM    Allergies: Lexapro [escitalopram oxalate]    Review of Systems  Musculoskeletal:        Shoulder pain    Updated Vital Signs BP 135/85 (BP Location: Right Arm)   Pulse 90   Temp 98 F (36.7 C) (Oral)   Resp 17   Ht 5' 2 (1.575 m)   Wt 90.3 kg   LMP 04/19/2024   SpO2 99%   BMI 36.42 kg/m   Physical Exam Vitals and nursing note reviewed.  Constitutional:      General: She is not in acute distress.    Appearance: She is not ill-appearing or toxic-appearing.  HENT:     Head: Normocephalic and atraumatic.     Mouth/Throat:     Mouth: Mucous membranes are moist.  Eyes:     General: No scleral icterus.       Right eye: No discharge.        Left eye: No discharge.     Conjunctiva/sclera: Conjunctivae normal.  Cardiovascular:     Rate and  Rhythm: Normal rate.     Pulses: Normal pulses.     Heart sounds: Normal heart sounds. No murmur heard. Pulmonary:     Effort: Pulmonary effort is normal. No respiratory distress.     Breath sounds: Normal breath sounds. No wheezing, rhonchi or rales.  Musculoskeletal:     Right lower leg: No edema.     Left lower leg: No edema.     Comments: Left upper trapezius is tender to palpation. No midline/cervical spine tenderness, step-offs, crepitus, or erythema/swelling. Left shoulder ROM fully intact. Full strength demonstrated. No erythema, increased warmth, or swelling of shoulder appreciated. Sensation to light touch intact. Brisk capillary refill. +2 radial pulses. Area non-tense.  Skin:    General: Skin is warm and dry.     Findings: No rash.  Neurological:     General: No focal deficit present.     Mental Status: She is alert and oriented to person, place, and time. Mental status is at baseline.  Psychiatric:        Mood and Affect: Mood normal.        Behavior: Behavior normal.     (all labs ordered are listed, but only abnormal results are displayed) Labs Reviewed -  No data to display  EKG: None  Radiology: No results found.   Procedures   Medications Ordered in the ED  acetaminophen  (TYLENOL ) tablet 1,000 mg (has no administration in time range)  ketorolac  (TORADOL ) 15 MG/ML injection 15 mg (has no administration in time range)  lidocaine  (LIDODERM ) 5 % 1 patch (has no administration in time range)                                    Medical Decision Making Risk OTC drugs. Prescription drug management.   This patient presents to the ED for concern of shoulder pain, this involves an extensive number of treatment options, and is a complaint that carries with it a high risk of complications and morbidity.  The differential diagnosis includes hemarthrosis, gout, septic joint, fracture, tendonitis, muscle strain, bursitis, compartment syndrome   Co morbidities that  complicate the patient evaluation  migraines   Additional history obtained:  Dr. Newlin PCP   Problem List / ED Course / Critical interventions / Medication management  Patient presents to ED concern for left shoulder pain radiating up her neck and down her left arm.  Patient stating that her muscles feel very inflamed and she lifts heavy boxes at work.  Intermittent tingling in left hand is not currently present.  Physical exam reassuring.  Patient afebrile with stable vitals. Symptoms seem to be related to muscle inflammation.  Educated patient that I do not believe an x-ray is necessary at this time given trapezius tenderness and lack of trauma with reassuring physical exam.  Patient agreeable with plan to withhold imaging today.  Will provide patient with shoulder pain cocktail and have them follow-up with orthopedics and PCP.  Patient educated on alternating Advil  and Tylenol  for pain.  Will also prescribe Robaxin  and give work note.  Patient agreeable with plan. I have reviewed the patients home medicines and have made adjustments as needed The patient has been appropriately medically screened and/or stabilized in the ED. I have low suspicion for any other emergent medical condition which would require further screening, evaluation or treatment in the ED or require inpatient management. At time of discharge the patient is hemodynamically stable and in no acute distress. I have discussed work-up results and diagnosis with patient and answered all questions. Patient is agreeable with discharge plan. We discussed strict return precautions for returning to the emergency department and they verbalized understanding.     Social Determinants of Health:  none      Final diagnoses:  Cervical radiculopathy    ED Discharge Orders          Ordered    methocarbamol  (ROBAXIN ) 500 MG tablet  2 times daily        05/06/24 1053               Hoy Nidia FALCON, [place] 05/06/24  1106    Neysa Caron PARAS, [place] 05/06/24 1518  "

## 2024-05-09 ENCOUNTER — Telehealth: Payer: Self-pay | Admitting: Family Medicine

## 2024-05-09 NOTE — Telephone Encounter (Signed)
 Copied from CRM [id number]. Topic: Appointments - Scheduling Inquiry for Clinic >> May 07, 2024  2:12 PM Sophia H wrote:  Reason for CRM: Patient is needing a HFU asap - told to follow up within a week & was discharged yesterday 12/30. No availability on my end, please reach out # [phone]

## 2024-05-09 NOTE — Telephone Encounter (Signed)
 Contacted patient, Appointment scheduled.

## 2024-05-14 ENCOUNTER — Telehealth: Payer: Self-pay | Admitting: Family Medicine

## 2024-05-14 NOTE — Telephone Encounter (Signed)
 Pt unconfirmed appt (per vr)

## 2024-05-16 ENCOUNTER — Encounter: Payer: Self-pay | Admitting: *Deleted

## 2024-05-16 ENCOUNTER — Other Ambulatory Visit (HOSPITAL_BASED_OUTPATIENT_CLINIC_OR_DEPARTMENT_OTHER): Payer: Self-pay

## 2024-05-16 ENCOUNTER — Ambulatory Visit: Payer: Self-pay | Attending: *Deleted | Admitting: *Deleted

## 2024-05-16 VITALS — BP 121/80 | HR 80 | Temp 99.0°F | Ht 62.0 in | Wt 196.2 lb

## 2024-05-16 DIAGNOSIS — R0989 Other specified symptoms and signs involving the circulatory and respiratory systems: Secondary | ICD-10-CM

## 2024-05-16 DIAGNOSIS — G8929 Other chronic pain: Secondary | ICD-10-CM

## 2024-05-16 DIAGNOSIS — M542 Cervicalgia: Secondary | ICD-10-CM

## 2024-05-16 MED ORDER — PREDNISONE 20 MG PO TABS
40.0000 mg | ORAL_TABLET | Freq: Every day | ORAL | 0 refills | Status: AC
  Filled 2024-05-16: qty 10, 5d supply, fill #0

## 2024-05-16 MED ORDER — CYCLOBENZAPRINE HCL 10 MG PO TABS
10.0000 mg | ORAL_TABLET | Freq: Every day | ORAL | 0 refills | Status: AC
  Filled 2024-05-16: qty 30, 30d supply, fill #0

## 2024-05-16 NOTE — Progress Notes (Signed)
 "   Patient ID: [name] [name], female    DOB: 07/31/1980  MRN: [id number]  CC: Medical Management of Chronic Issues (Question of BP/Numbness in the left arm worst in the thumb X3 weeks/Left leg pain X 2 months )   Subjective: [name] [name] is a 44 y.o. female who presents for evaluation of neck (x 3 weeks) and back pain (x 2 months) with radiculopathy (to the left thumb and left foot). No obvious injury to the area. She does have an active job that includes heavy lifting. She denies weakness in the left arm or leg but does have sensations of numbness and tingling.  Labile blood pressure: Patient remarks that her blood pressure has been elevated the last couple of visits for urgent care issues.  She has no symptoms of concern such as headache, blurred vision, ringing or buzzing in her ears, chest pain, palpitations, shortness of breath or symptom into an extremity that to her would suggest a stroke. She does adhere to a healthy diet as her husband is prediabetic and has history of high blood pressure. She stays active with her job.  Her concerns today include:  Concerned about  her numb left thumb despite treatment with anti-inflammatories and muscle relaxers. Labile blood pressure associated with acute issues (associated with herpes outbreak,  neck and back pain)    Patient Active Problem List   Diagnosis Date Noted   Rosacea 07/10/2022   Snoring 10/25/2021   Stress 06/02/2021   Herpes simplex viral infection 05/18/2021     Medications Ordered Prior to Encounter[1]  Allergies[2]  Social History   Socioeconomic History   Marital status: Single    Spouse name: Not on file   Number of children: 3   Years of education: Not on file   Highest education level: Not on file  Occupational History   Occupation: quality control  Tobacco Use   Smoking status: Former    Current packs/day: 0.00    Types: Cigarettes    Quit date: 10/23/2011    Years since quitting: 12.5   Smokeless  tobacco: Never  Vaping Use   Vaping status: Every Day  Substance and Sexual Activity   Alcohol use: Yes    Comment: 2-3 per night   Drug use: No   Sexual activity: Yes    Birth control/protection: None  Other Topics Concern   Not on file  Social History Narrative   Not on file   Social Drivers of Health   Tobacco Use: Medium Risk (05/16/2024)   Patient History    Smoking Tobacco Use: Former    Smokeless Tobacco Use: Never    Passive Exposure: Not on Actuary Strain: Not on file  Food Insecurity: Not on file  Transportation Needs: Not on file  Physical Activity: Not on file  Stress: Not on file  Social Connections: Not on file  Intimate Partner Violence: Not on file  Depression (PHQ2-9): Medium Risk (05/16/2024)   Depression (PHQ2-9)    PHQ-2 Score: 7  Alcohol Screen: Not on file  Housing: Not on file  Utilities: Not on file  Health Literacy: Not on file    Family History  Problem Relation Age of Onset   Peripheral vascular disease Mother        varicosities   Depression Mother    Colon cancer Father 106       Deceased at 53   Depression Brother    ADD / ADHD Son    Diabetes Maternal Grandmother  Lung cancer Maternal Grandfather    Heart disease Maternal Aunt    Breast cancer Maternal Aunt    Ovarian cancer Maternal Aunt    Stomach cancer Neg Hx    Rectal cancer Neg Hx    Esophageal cancer Neg Hx     Past Surgical History:  Procedure Laterality Date   TUBAL LIGATION  03/24/2012   Procedure: POST PARTUM TUBAL LIGATION;  Surgeon: Norleen LULLA Server, MD;  Location: [facility];  Service: Gynecology;  Laterality: Bilateral;  Bilateral post partum tubal ligation with filshie clips    ROS: Review of Systems Negative except as stated above  PHYSICAL EXAM: BP 121/80   Pulse 80   Temp 99 F (37.2 C) (Oral)   Ht 5' 2 (1.575 m)   Wt 196 lb 3.2 oz (89 kg)   LMP 04/19/2024   SpO2 99%   BMI 35.89 kg/m   Physical Exam Vitals and nursing note  reviewed.  Constitutional:      Appearance: Normal appearance.  Cardiovascular:     Rate and Rhythm: Normal rate and regular rhythm.  Pulmonary:     Effort: Pulmonary effort is normal.     Breath sounds: Normal breath sounds.  Abdominal:     General: Abdomen is flat. Bowel sounds are normal.     Palpations: Abdomen is soft.  Musculoskeletal:     Cervical back: Normal range of motion and neck supple.     Comments: Pain with chin to chest and ear to shoulder maneuver.  Strong shrug.  Pain with forward and lateral flexion  Skin:    General: Skin is warm and dry.  Neurological:     Mental Status: She is alert. Mental status is at baseline.     Comments: Cranial nerves II through XII intact with good reflexes, sensation and motor function          Latest Ref Rng & Units 01/03/2022    4:34 PM 08/29/2021    6:37 PM 09/02/2020    8:36 AM  CMP  Glucose 70 - 99 mg/dL 86  99  898   BUN 6 - 24 mg/dL 10  9  9    Creatinine 0.57 - 1.00 mg/dL 9.31  9.31  9.28   Sodium 134 - 144 mmol/L 142  139  140   Potassium 3.5 - 5.2 mmol/L 4.4  4.7  4.5   Chloride 96 - 106 mmol/L 102  108  104   CO2 20 - 29 mmol/L 26  26  23    Calcium 8.7 - 10.2 mg/dL 9.1  8.9  9.2   Total Protein 6.0 - 8.5 g/dL 6.9   6.6   Total Bilirubin 0.0 - 1.2 mg/dL 0.2   0.4   Alkaline Phos 44 - 121 IU/L 80   75   AST 0 - 40 IU/L 16   12   ALT 0 - 32 IU/L 22   18    Lipid Panel     Component Value Date/Time   CHOL 172 01/03/2022 1634   TRIG 152 (H) 01/03/2022 1634   HDL 56 01/03/2022 1634   CHOLHDL 3.1 09/02/2020 0836   LDLCALC 90 01/03/2022 1634    CBC    Component Value Date/Time   WBC 7.6 08/29/2021 1837   RBC 4.47 08/29/2021 1837   HGB 13.5 08/29/2021 1837   HGB 13.8 11/24/2019 1132   HCT 41.2 08/29/2021 1837   HCT 41.9 11/24/2019 1132   PLT 285 08/29/2021 1837   PLT 260  11/24/2019 1132   MCV 92.2 08/29/2021 1837   MCV 90 11/24/2019 1132   MCH 30.2 08/29/2021 1837   MCHC 32.8 08/29/2021 1837   RDW 12.5  08/29/2021 1837   RDW 12.7 11/24/2019 1132   LYMPHSABS 1.7 08/29/2021 1837   LYMPHSABS 1.4 11/24/2019 1132   MONOABS 0.5 08/29/2021 1837   EOSABS 0.3 08/29/2021 1837   EOSABS 0.2 11/24/2019 1132   BASOSABS 0.1 08/29/2021 1837   BASOSABS 0.1 11/24/2019 1132    Results for orders placed or performed during the hospital encounter of 04/11/23  Group A Strep by PCR   Collection Time: 04/11/23  4:31 PM   Specimen: Throat; Sterile Swab  Result Value Ref Range   Group A Strep by PCR NOT DETECTED NOT DETECTED  Resp panel by RT-PCR (RSV, Flu A&B, Covid) Throat   Collection Time: 04/11/23  4:31 PM   Specimen: Throat; Nasal Swab  Result Value Ref Range   SARS Coronavirus 2 by RT PCR NEGATIVE NEGATIVE   Influenza A by PCR NEGATIVE NEGATIVE   Influenza B by PCR NEGATIVE NEGATIVE   Resp Syncytial Virus by PCR NEGATIVE NEGATIVE     ASSESSMENT AND PLAN:  Assessment & Plan Neck pain With radiculopathy, without injury. She does have a physical job where she does heavy lifting. She has sensory deficit but no motor deficit. No improvement on anti-inflammatories and muscle relaxers. Okay for trial of prednisone  and cyclobenzaprine .  Encouraged to use heat or ice whichever is more comfortable. Reviewed physical therapy home program with her while in the clinic. Call if symptoms persist or worsen  Orders:   predniSONE  (DELTASONE ) 20 MG tablet; Take 2 tablets (40 mg total) by mouth daily with breakfast.   cyclobenzaprine  (FLEXERIL ) 10 MG tablet; Take 1 tablet (10 mg total) by mouth at bedtime.  Chronic left-sided low back pain with left-sided sciatica She has had low back pain with radiculopathy down the left leg as far as to the foot over the last 2 months. No obvious injury though she does have a physical job with lifting bending stooping and stretching. She has sensory changes in this dermatome but no motor abnormality No improvement on anti-inflammatories and muscle relaxers. She is  agreeable to prednisone  burst and trial of cyclobenzaprine .   She will continue to use heat or ice whichever is more comfortable.  Notify us  if symptoms persist or worsen   Labile blood pressure She has had some elevated blood pressures in the past while at urgent care for evaluation of acute problems. Her blood pressure was under good control today. She denies symptoms as concern. She continues to attempt to adhere to a heart healthy diet and exercise daily Continue to follow and notify us  if her blood pressure remains elevated consistently         Patient was given the opportunity to ask questions.  Patient verbalized understanding of the plan and was able to repeat key elements of the plan.   This documentation was completed using Paediatric nurse.  Any transcriptional errors are unintentional.     Requested Prescriptions   Signed Prescriptions Disp Refills   predniSONE  (DELTASONE ) 20 MG tablet 10 tablet 0    Sig: Take 2 tablets (40 mg total) by mouth daily with breakfast.   cyclobenzaprine  (FLEXERIL ) 10 MG tablet 30 tablet 0    Sig: Take 1 tablet (10 mg total) by mouth at bedtime.    Return in about 3 months (around 08/14/2024) for with PCP.  [name] [name]  H, NP      [1]  Current Outpatient Medications on File Prior to Visit  Medication Sig Dispense Refill   methocarbamol  (ROBAXIN ) 500 MG tablet Take 1 tablet (500 mg total) by mouth 2 (two) times daily. 20 tablet 0   meloxicam  (MOBIC ) 15 MG tablet Take 1 tablet (15 mg total) by mouth daily. (Patient not taking: Reported on 05/16/2024) 30 tablet 3   No current facility-administered medications on file prior to visit.  [2]  Allergies Allergen Reactions   Lexapro [Escitalopram Oxalate] Nausea And Vomiting    Shakiness/dizziness   "

## 2024-05-16 NOTE — Patient Instructions (Signed)
 Today we discussed your labile blood pressure associated with urgent medical issues (systolic of 150 and 138).  most likely her blood pressure was elevated on those occasions due to not feeling well.  your blood pressures excellent today. Continue to follow it and let us  know if it becomes elevated and does not return to normal. Adhere to a heart healthy diet and exercise daily We also discussed your neck  and low back pain with radiculopathy. We looked at the dermatome chart to understand how nerve pain radiates. Since you have persistent symptoms following anti-inflammatory and muscle relaxer use we will start prednisone  burst and new  muscle relaxer in order to break the cycle of muscle inflammation and nerve pain Remember to alternate ice and heat whichever is more comfortable Use your  physical therapy home program that we reviewed while you are in the clinic to increase your flexibility and strength. Keep your follow-up with your PCP

## 2024-08-18 ENCOUNTER — Ambulatory Visit: Payer: Self-pay | Admitting: Family Medicine
